# Patient Record
Sex: Female | Born: 1955 | Race: White | Hispanic: No | Marital: Married | Smoking: Current some day smoker
Health system: Southern US, Community
[De-identification: ages and names within clinical notes are randomized; demographics above are authoritative.]

## PROBLEM LIST (undated history)

## (undated) DIAGNOSIS — T7840XA Allergy, unspecified, initial encounter: Secondary | ICD-10-CM

## (undated) DIAGNOSIS — M1990 Unspecified osteoarthritis, unspecified site: Secondary | ICD-10-CM

## (undated) DIAGNOSIS — F32A Depression, unspecified: Secondary | ICD-10-CM

## (undated) DIAGNOSIS — F329 Major depressive disorder, single episode, unspecified: Secondary | ICD-10-CM

## (undated) HISTORY — PX: BREAST SURGERY: SHX581

## (undated) HISTORY — DX: Allergy, unspecified, initial encounter: T78.40XA

## (undated) HISTORY — PX: EYE SURGERY: SHX253

## (undated) HISTORY — DX: Unspecified osteoarthritis, unspecified site: M19.90

## (undated) HISTORY — DX: Major depressive disorder, single episode, unspecified: F32.9

## (undated) HISTORY — DX: Depression, unspecified: F32.A

---

## 1998-11-01 ENCOUNTER — Other Ambulatory Visit: Admission: RE | Admit: 1998-11-01 | Discharge: 1998-11-01 | Payer: Self-pay | Admitting: Family Medicine

## 1999-12-31 ENCOUNTER — Other Ambulatory Visit: Admission: RE | Admit: 1999-12-31 | Discharge: 1999-12-31 | Payer: Self-pay | Admitting: Family Medicine

## 2001-12-08 ENCOUNTER — Emergency Department (HOSPITAL_COMMUNITY): Admission: EM | Admit: 2001-12-08 | Discharge: 2001-12-08 | Payer: Self-pay | Admitting: *Deleted

## 2003-02-14 ENCOUNTER — Encounter: Admission: RE | Admit: 2003-02-14 | Discharge: 2003-02-14 | Payer: Self-pay | Admitting: Family Medicine

## 2003-03-09 ENCOUNTER — Other Ambulatory Visit: Admission: RE | Admit: 2003-03-09 | Discharge: 2003-03-09 | Payer: Self-pay | Admitting: Family Medicine

## 2003-03-22 ENCOUNTER — Encounter: Admission: RE | Admit: 2003-03-22 | Discharge: 2003-03-22 | Payer: Self-pay | Admitting: Family Medicine

## 2004-03-07 ENCOUNTER — Ambulatory Visit: Payer: Self-pay | Admitting: Family Medicine

## 2004-03-07 ENCOUNTER — Other Ambulatory Visit: Admission: RE | Admit: 2004-03-07 | Discharge: 2004-03-07 | Payer: Self-pay | Admitting: Family Medicine

## 2004-03-09 ENCOUNTER — Encounter: Admission: RE | Admit: 2004-03-09 | Discharge: 2004-03-09 | Payer: Self-pay | Admitting: Family Medicine

## 2004-03-27 ENCOUNTER — Ambulatory Visit: Payer: Self-pay | Admitting: Family Medicine

## 2004-04-06 ENCOUNTER — Encounter: Admission: RE | Admit: 2004-04-06 | Discharge: 2004-04-06 | Payer: Self-pay | Admitting: Family Medicine

## 2004-04-23 ENCOUNTER — Ambulatory Visit: Payer: Self-pay | Admitting: Internal Medicine

## 2005-02-04 ENCOUNTER — Ambulatory Visit: Payer: Self-pay | Admitting: Family Medicine

## 2005-02-28 IMAGING — CT CT PARANASAL SINUSES LIMITED
1 series · 16 of 24 positions shown, 20 images · non-contrast
Comparison: none

CLINICAL DATA: Headaches and facial pressure.
 LIMITED SINUS CT WITHOUT CONTRAST

[Series 2: limited sinus · axial · 0.33mm/px · z∈[-12,+73]mm · 16 of 24 slices shown, 20 images]
[im 2/24  brain]
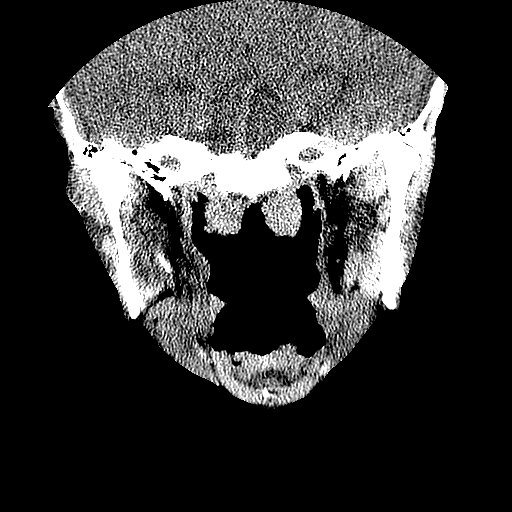
[im 2/24  bone]
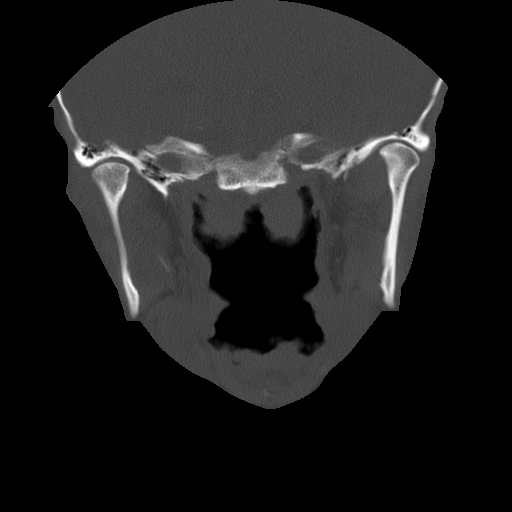
[im 4/24  bone]
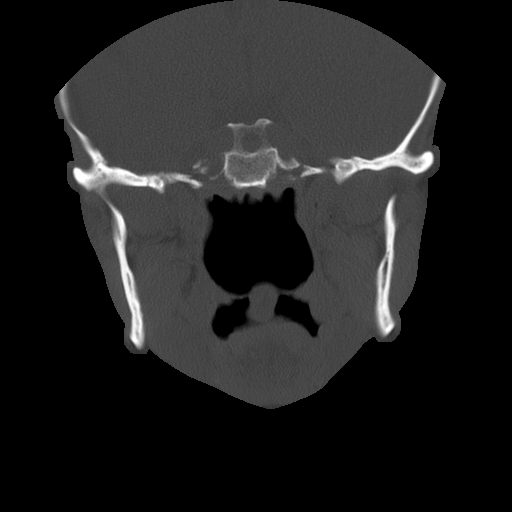
[im 5/24  bone]
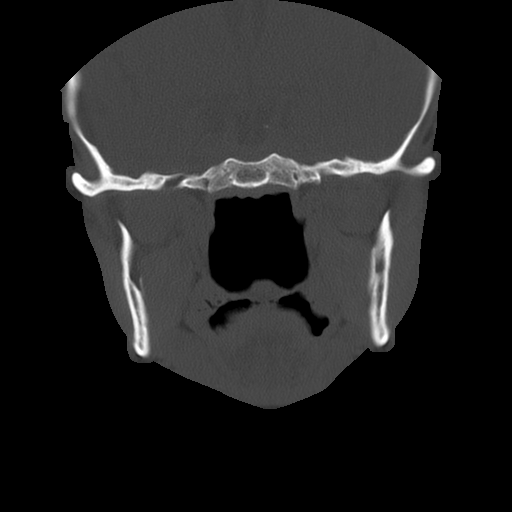
[im 6/24  bone]
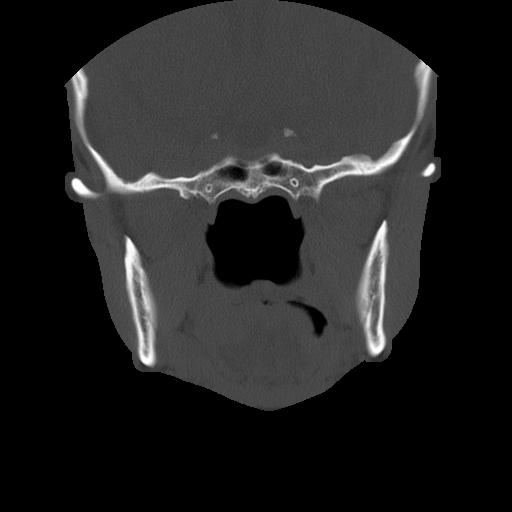
[im 8/24  brain]
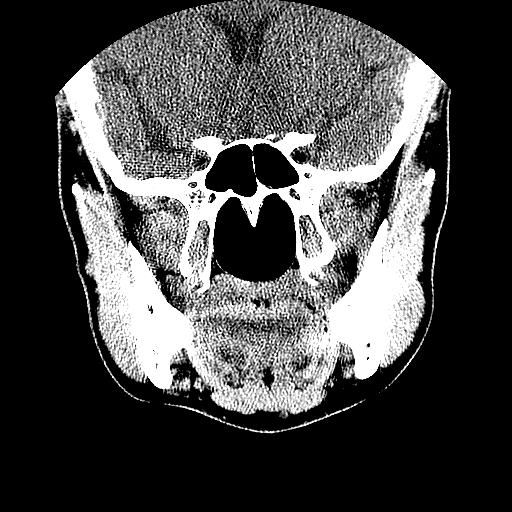
[im 8/24  bone]
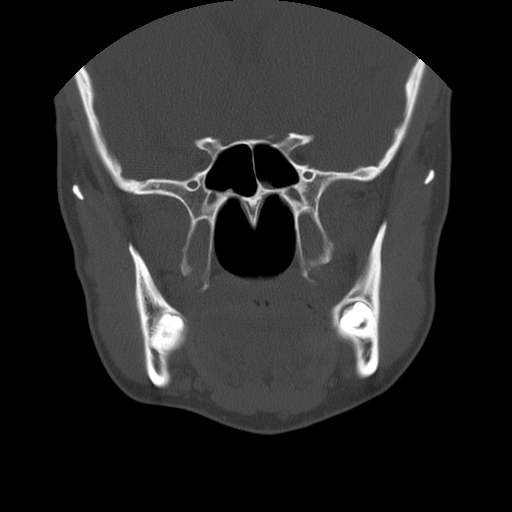
[im 9/24  bone]
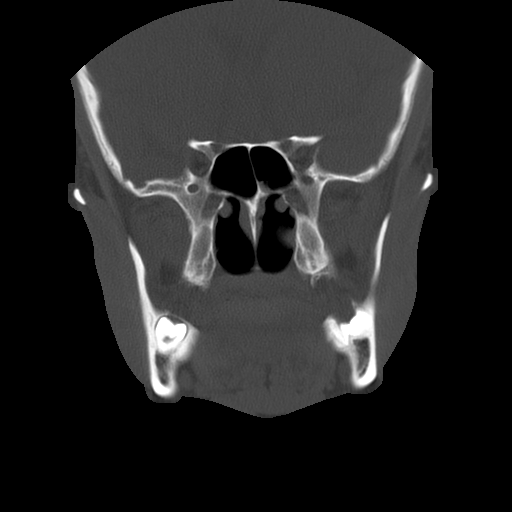
[im 10/24  bone]
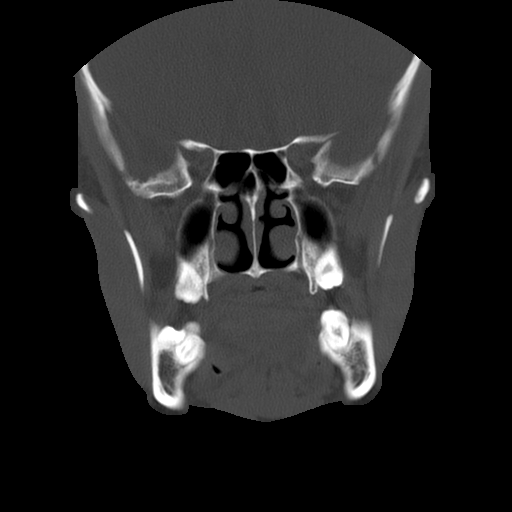
[im 12/24  bone]
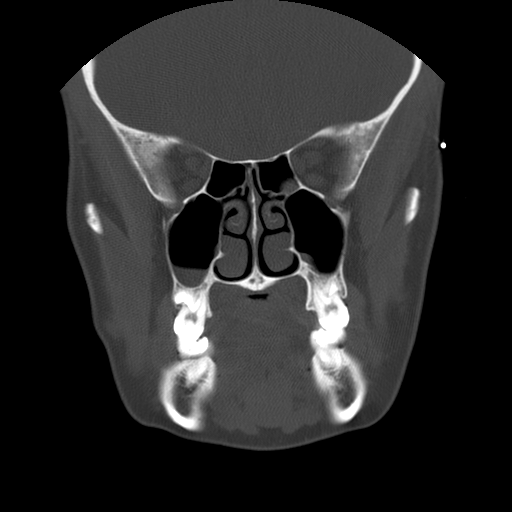
[im 13/24  brain]
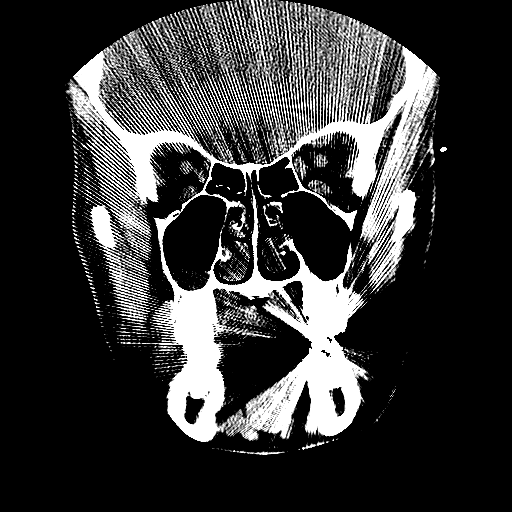
[im 13/24  bone]
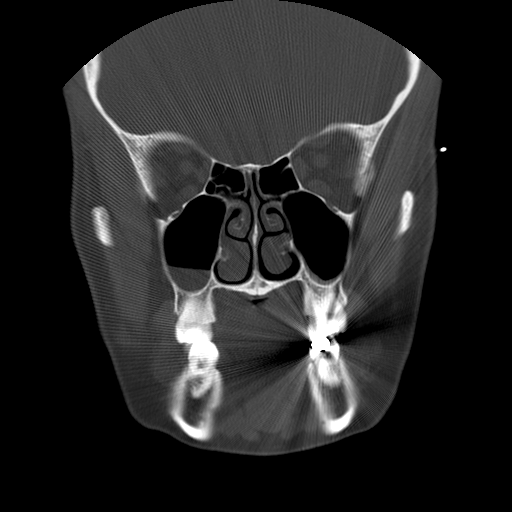
[im 15/24  bone]
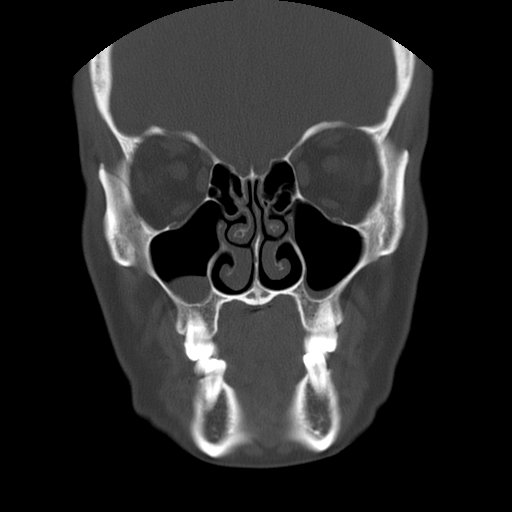
[im 16/24  bone]
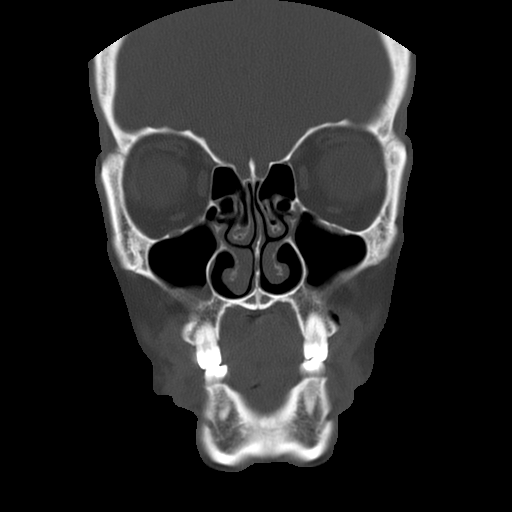
[im 17/24  bone]
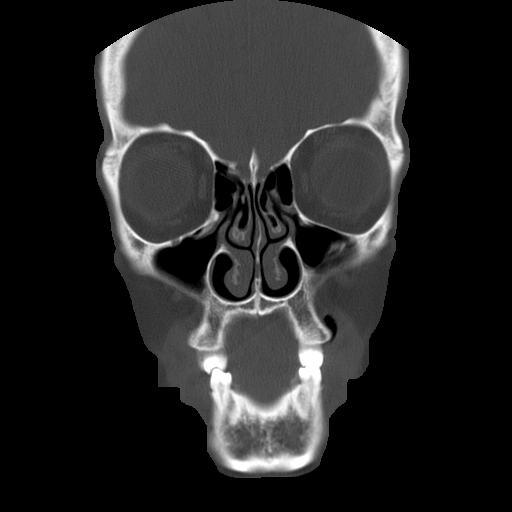
[im 19/24  brain]
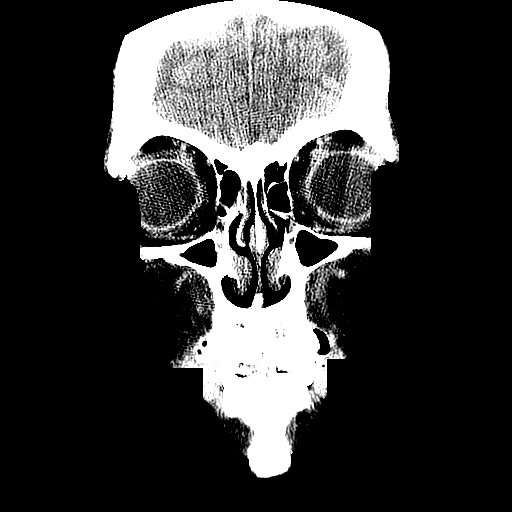
[im 19/24  bone]
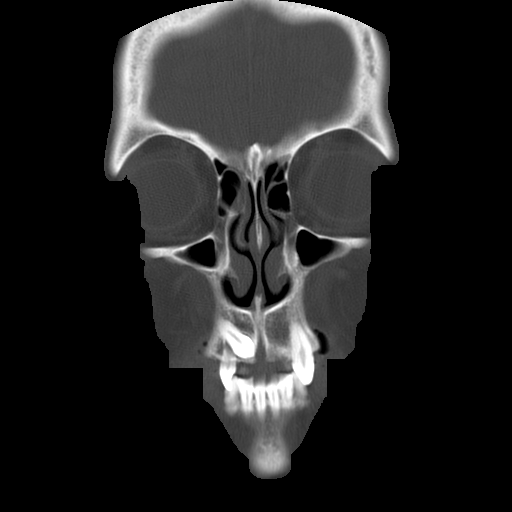
[im 20/24  bone]
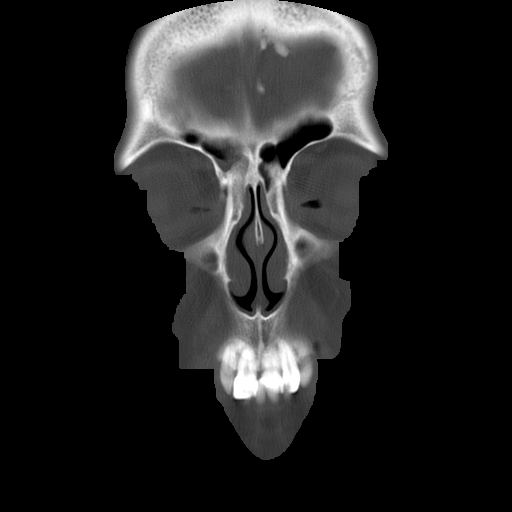
[im 21/24  bone]
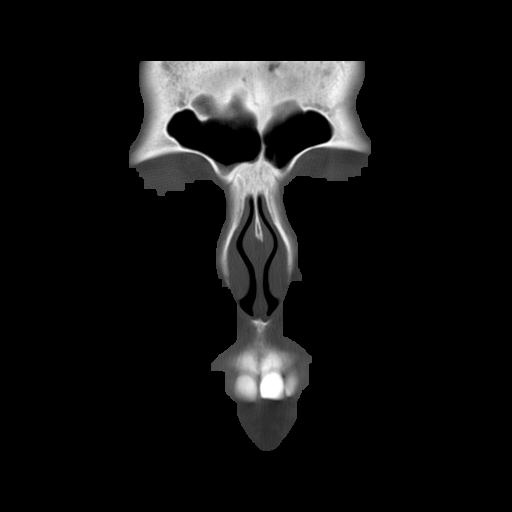
[im 23/24  bone]
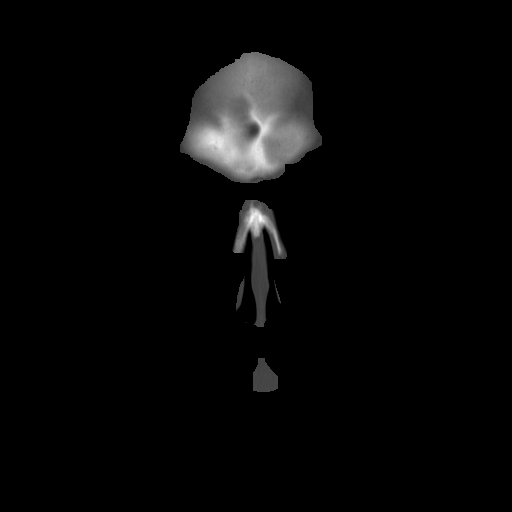

[16 of 24 positions shown; findings below may reference images not displayed]

FINDINGS: 4 mm noncontiguous direct coronal images were obtained through the face in bone algorithm.  Minimal mucosal thickening is seen in the right frontal sinus.  Air fluid levels are seen in the maxillary sinuses bilaterally, right greater than left.  There is polypoid mucosal thickening in the left sphenoid sinus.
 IMPRESSION
 Acute on chronic paranasal sinusitis.

 co

## 2005-03-08 ENCOUNTER — Ambulatory Visit: Payer: Self-pay | Admitting: Family Medicine

## 2005-03-08 ENCOUNTER — Encounter: Payer: Self-pay | Admitting: Family Medicine

## 2005-03-08 ENCOUNTER — Other Ambulatory Visit: Admission: RE | Admit: 2005-03-08 | Discharge: 2005-03-08 | Payer: Self-pay | Admitting: Family Medicine

## 2005-11-12 ENCOUNTER — Ambulatory Visit: Payer: Self-pay | Admitting: Family Medicine

## 2006-03-24 IMAGING — US US TRANSVAGINAL NON-OB
1 series · 14 of 25 positions shown · non-contrast
Comparison: None.

CLINICAL DATA: Dysfunctional uterine bleeding ? could not palpate left ovary on physical exam. 
 PELVIC ULTRASOUND WITH TRANSVAGINAL ULTRASOUND:

[Series 1: unknown · 0.24mm/px · 14 of 46 slices shown]
[im 1/46]
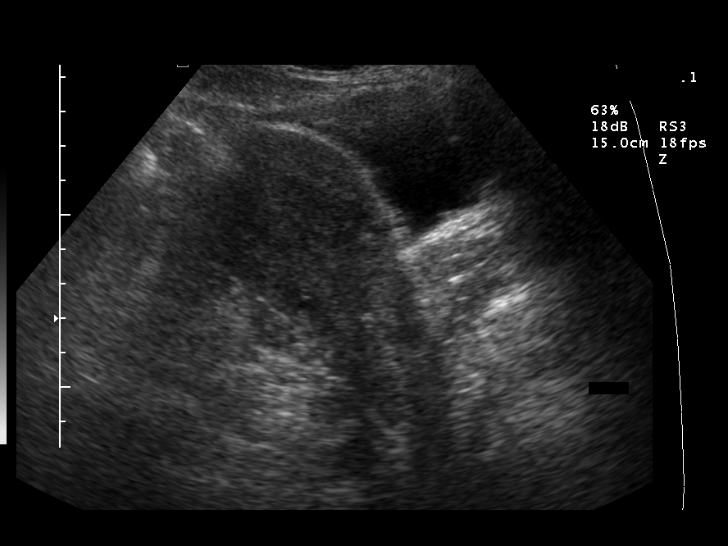
[im 4/46]
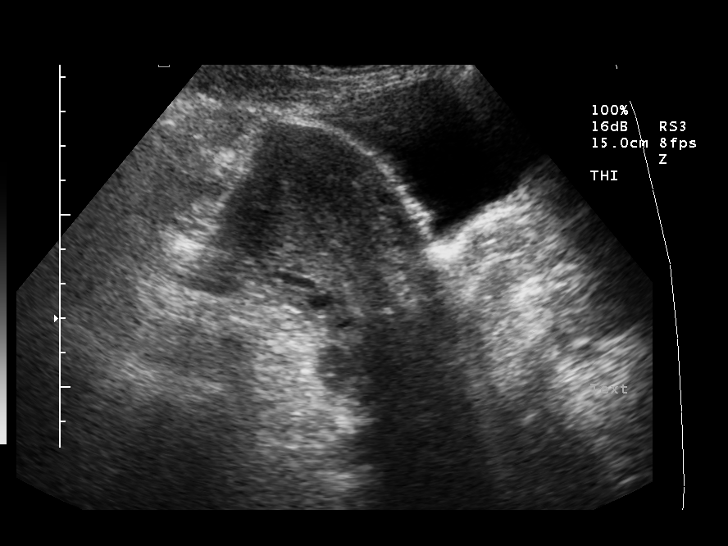
[im 8/46]
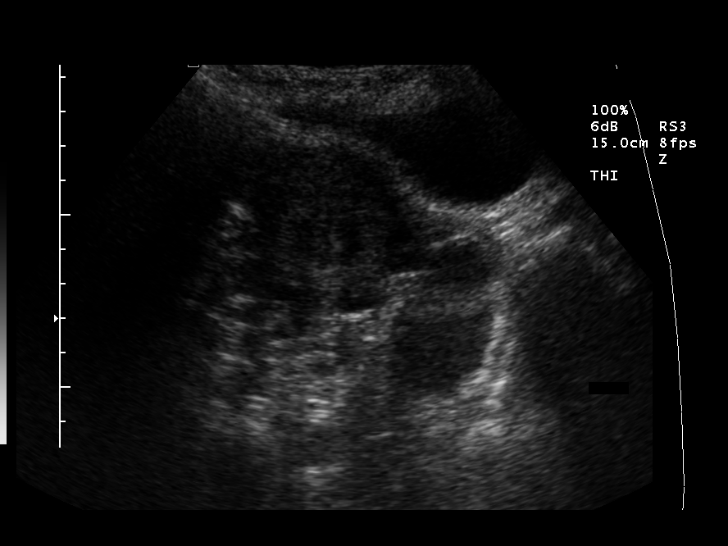
[im 12/46]
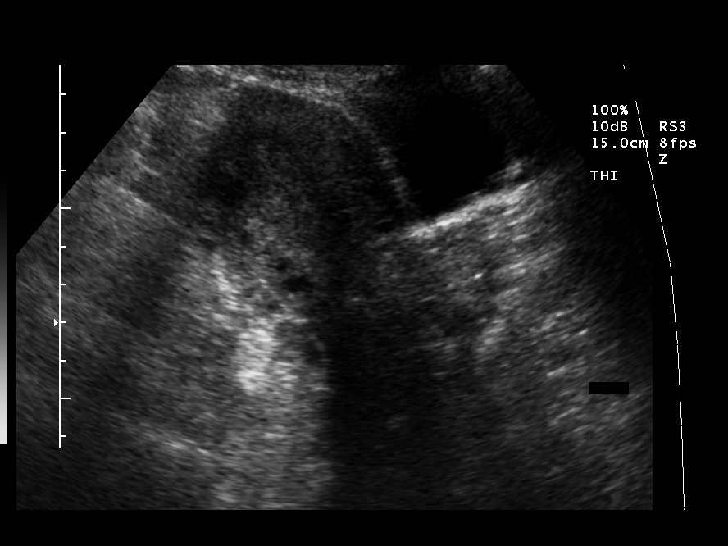
[im 16/46]
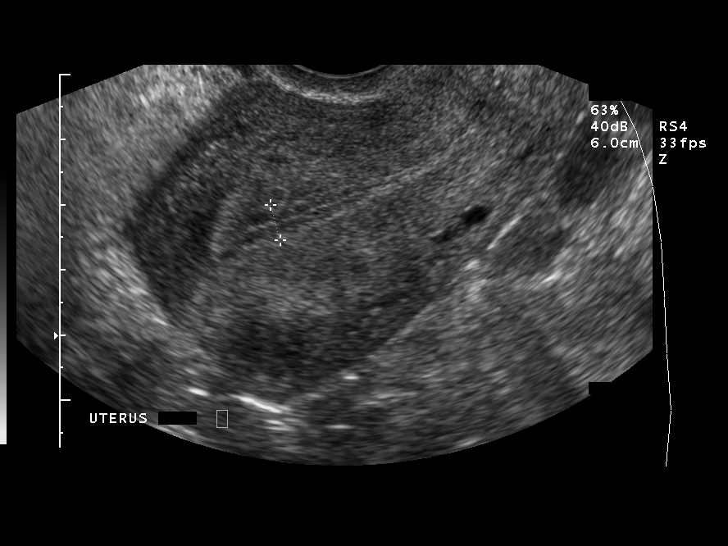
[im 17/46]
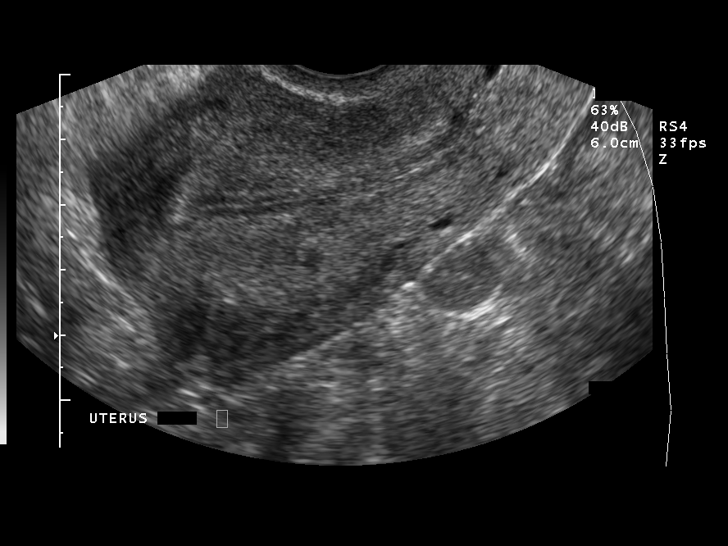
[im 21/46]
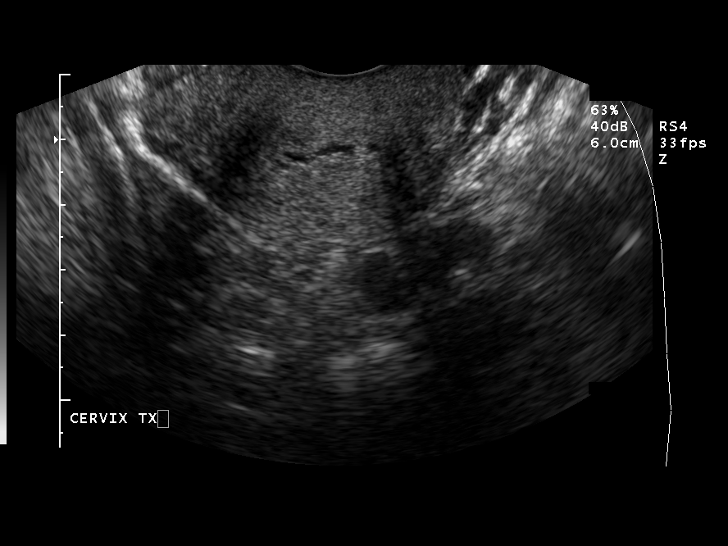
[im 25/46]
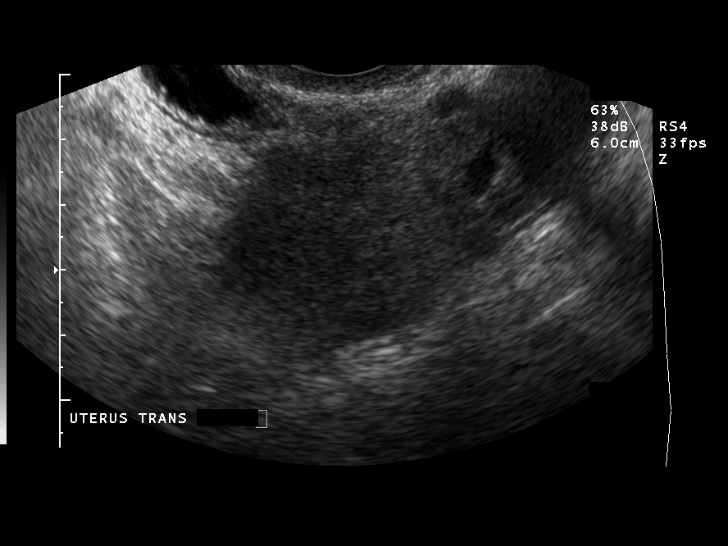
[im 29/46]
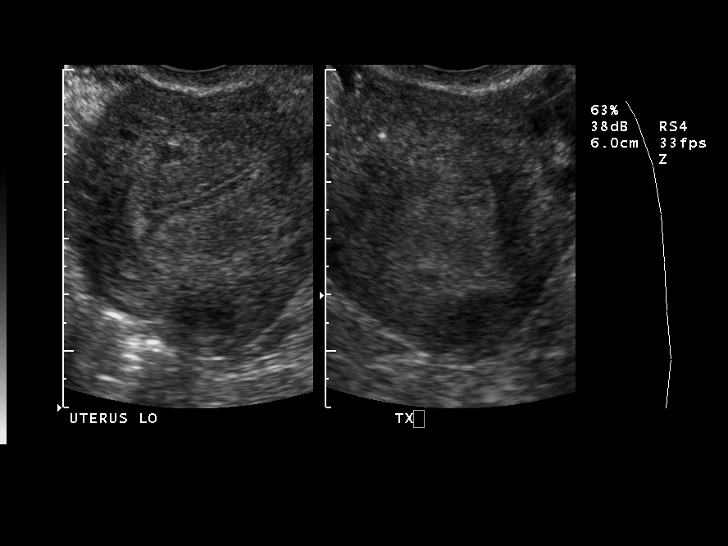
[im 31/46]
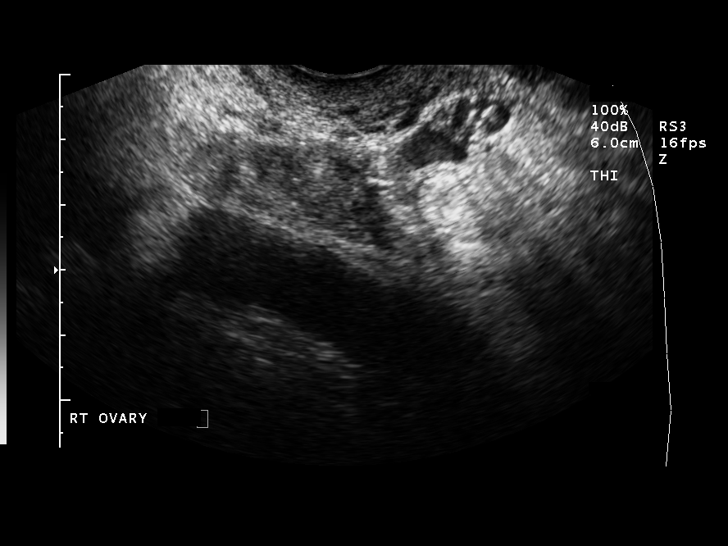
[im 34/46]
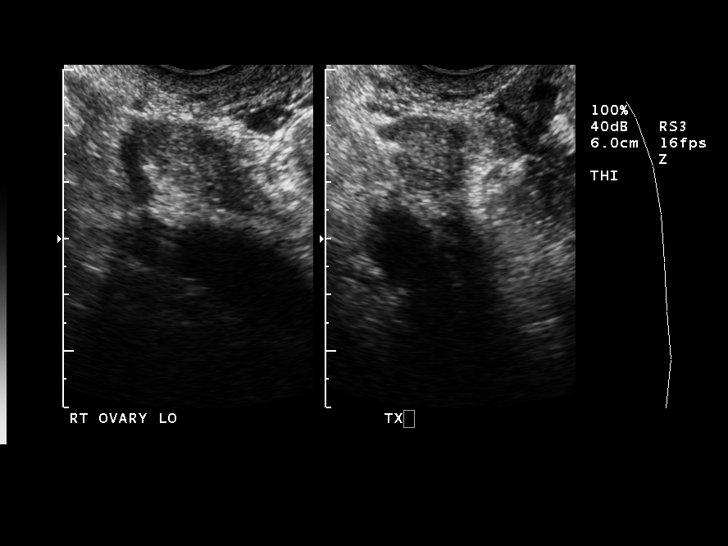
[im 38/46]
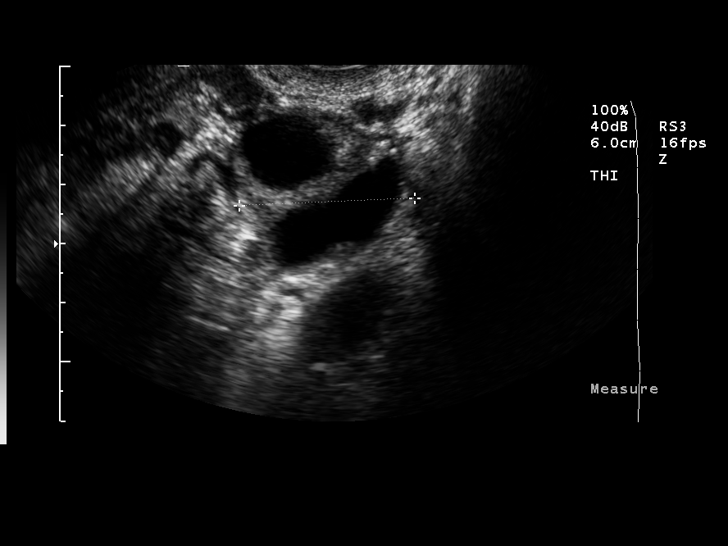
[im 42/46]
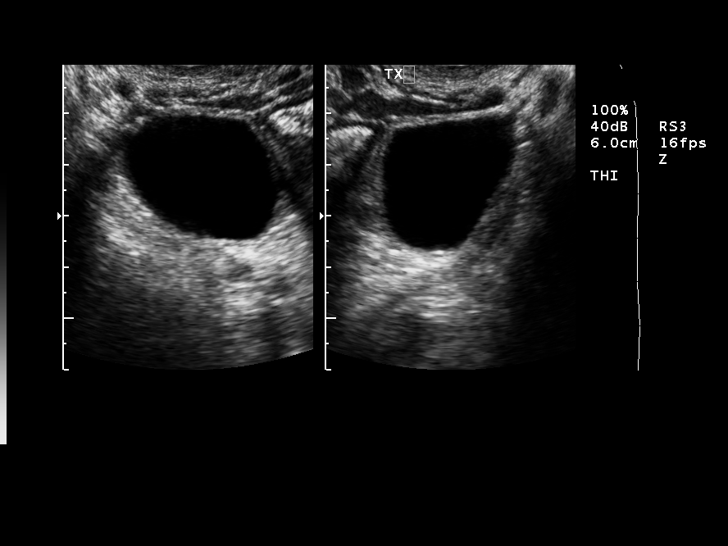
[im 46/46]
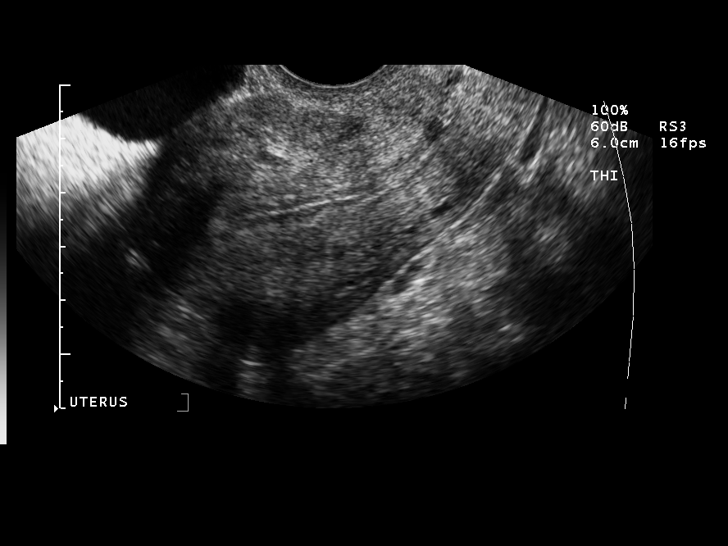

[14 of 25 positions shown; findings below may reference images not displayed]

FINDINGS: Overall uterine size is upper limits of normal with measurements of 9.2 x 4.7 x 5.4 cm.  Transvaginally, two small hypodense lesions of the myometrium are noted.  A 5 mm lesion is noted anteriorly, and a 12 mm lesion is noted posteriorly.  These are most consistent with small fibroids.  None are submucosal in location. The endometrium is normal, measuring 5.6 mm with a trilaminar appearance.  
 The right ovary is 2.5 x 1.2 x 1.2 cm.  No dominant cysts or masses.  The left ovary measures 5.4 x 2.8 x 3.0 cm.  Within the left ovary, there appear to be two simple cysts that are abutting.  One is 2.6 x 2.0 x 2.4 cm.  The other is 3.2 x 2.5 x 2.5 cm. 
 No fluid in the cul-de-sac.
IMPRESSION: 1.  Two small uterine fibroid lesions.
 2. Normal endometrium.
 3.  Normal right ovary.
 4.  Two simple cysts of the left ovary.

## 2006-03-27 ENCOUNTER — Encounter: Admission: RE | Admit: 2006-03-27 | Discharge: 2006-03-27 | Payer: Self-pay | Admitting: Orthopedic Surgery

## 2006-06-16 ENCOUNTER — Ambulatory Visit: Payer: Self-pay | Admitting: Internal Medicine

## 2006-06-16 LAB — CONVERTED CEMR LAB
Bilirubin Urine: NEGATIVE
Hemoglobin, Urine: NEGATIVE
Ketones, ur: NEGATIVE mg/dL
Leukocytes, UA: NEGATIVE
Nitrite: NEGATIVE
Protein, ur: NEGATIVE mg/dL
RBC / HPF: NONE SEEN (ref <3)
Specific Gravity, Urine: 1.022 (ref 1.005–1.03)
Urine Glucose: NEGATIVE mg/dL
Urobilinogen, UA: 0.2 (ref 0.0–1.0)
WBC, UA: NONE SEEN cells/hpf (ref <3)
pH: 6 (ref 5.0–8.0)

## 2006-06-17 ENCOUNTER — Telehealth (INDEPENDENT_AMBULATORY_CARE_PROVIDER_SITE_OTHER): Payer: Self-pay | Admitting: *Deleted

## 2006-08-15 DIAGNOSIS — F329 Major depressive disorder, single episode, unspecified: Secondary | ICD-10-CM

## 2006-09-02 ENCOUNTER — Ambulatory Visit: Payer: Self-pay | Admitting: Internal Medicine

## 2006-09-09 ENCOUNTER — Ambulatory Visit: Payer: Self-pay | Admitting: Family Medicine

## 2006-09-09 DIAGNOSIS — Z9889 Other specified postprocedural states: Secondary | ICD-10-CM

## 2006-09-09 DIAGNOSIS — M1990 Unspecified osteoarthritis, unspecified site: Secondary | ICD-10-CM | POA: Insufficient documentation

## 2006-09-09 DIAGNOSIS — D179 Benign lipomatous neoplasm, unspecified: Secondary | ICD-10-CM | POA: Insufficient documentation

## 2006-09-09 DIAGNOSIS — B354 Tinea corporis: Secondary | ICD-10-CM | POA: Insufficient documentation

## 2006-10-16 ENCOUNTER — Ambulatory Visit: Payer: Self-pay | Admitting: Family Medicine

## 2006-10-16 DIAGNOSIS — J019 Acute sinusitis, unspecified: Secondary | ICD-10-CM | POA: Insufficient documentation

## 2006-10-20 ENCOUNTER — Encounter: Payer: Self-pay | Admitting: Internal Medicine

## 2006-10-20 ENCOUNTER — Encounter: Payer: Self-pay | Admitting: Family Medicine

## 2006-10-20 ENCOUNTER — Ambulatory Visit: Payer: Self-pay | Admitting: Internal Medicine

## 2006-11-10 ENCOUNTER — Encounter: Payer: Self-pay | Admitting: Family Medicine

## 2007-03-27 ENCOUNTER — Encounter: Admission: RE | Admit: 2007-03-27 | Discharge: 2007-03-27 | Payer: Self-pay | Admitting: Otolaryngology

## 2007-04-03 ENCOUNTER — Encounter: Admission: RE | Admit: 2007-04-03 | Discharge: 2007-04-03 | Payer: Self-pay | Admitting: Orthopedic Surgery

## 2007-06-08 ENCOUNTER — Telehealth (INDEPENDENT_AMBULATORY_CARE_PROVIDER_SITE_OTHER): Payer: Self-pay | Admitting: *Deleted

## 2007-06-18 ENCOUNTER — Ambulatory Visit: Payer: Self-pay | Admitting: Family Medicine

## 2007-09-11 ENCOUNTER — Ambulatory Visit: Payer: Self-pay | Admitting: Family Medicine

## 2007-09-11 DIAGNOSIS — J069 Acute upper respiratory infection, unspecified: Secondary | ICD-10-CM | POA: Insufficient documentation

## 2007-09-14 ENCOUNTER — Telehealth (INDEPENDENT_AMBULATORY_CARE_PROVIDER_SITE_OTHER): Payer: Self-pay | Admitting: *Deleted

## 2007-10-12 ENCOUNTER — Ambulatory Visit: Payer: Self-pay | Admitting: Family Medicine

## 2007-11-06 ENCOUNTER — Encounter: Payer: Self-pay | Admitting: Family Medicine

## 2008-06-10 ENCOUNTER — Encounter: Payer: Self-pay | Admitting: Family Medicine

## 2008-06-21 ENCOUNTER — Other Ambulatory Visit: Admission: RE | Admit: 2008-06-21 | Discharge: 2008-06-21 | Payer: Self-pay | Admitting: Family Medicine

## 2008-06-21 ENCOUNTER — Ambulatory Visit: Payer: Self-pay | Admitting: Family Medicine

## 2008-06-21 ENCOUNTER — Encounter: Payer: Self-pay | Admitting: Family Medicine

## 2008-06-22 ENCOUNTER — Encounter (INDEPENDENT_AMBULATORY_CARE_PROVIDER_SITE_OTHER): Payer: Self-pay | Admitting: *Deleted

## 2008-06-24 ENCOUNTER — Encounter (INDEPENDENT_AMBULATORY_CARE_PROVIDER_SITE_OTHER): Payer: Self-pay | Admitting: *Deleted

## 2008-07-07 ENCOUNTER — Encounter (INDEPENDENT_AMBULATORY_CARE_PROVIDER_SITE_OTHER): Payer: Self-pay | Admitting: *Deleted

## 2008-08-01 ENCOUNTER — Encounter: Payer: Self-pay | Admitting: Family Medicine

## 2008-11-23 ENCOUNTER — Telehealth (INDEPENDENT_AMBULATORY_CARE_PROVIDER_SITE_OTHER): Payer: Self-pay | Admitting: *Deleted

## 2008-11-24 ENCOUNTER — Telehealth (INDEPENDENT_AMBULATORY_CARE_PROVIDER_SITE_OTHER): Payer: Self-pay | Admitting: *Deleted

## 2008-11-24 ENCOUNTER — Ambulatory Visit: Payer: Self-pay | Admitting: Family Medicine

## 2009-01-31 ENCOUNTER — Ambulatory Visit: Payer: Self-pay | Admitting: Family Medicine

## 2009-07-14 ENCOUNTER — Ambulatory Visit: Payer: Self-pay | Admitting: Family Medicine

## 2009-07-14 DIAGNOSIS — B360 Pityriasis versicolor: Secondary | ICD-10-CM

## 2009-07-25 ENCOUNTER — Ambulatory Visit: Payer: Self-pay | Admitting: Family Medicine

## 2009-08-17 ENCOUNTER — Ambulatory Visit: Payer: Self-pay | Admitting: Family Medicine

## 2009-08-17 ENCOUNTER — Other Ambulatory Visit: Admission: RE | Admit: 2009-08-17 | Discharge: 2009-08-17 | Payer: Self-pay | Admitting: Family Medicine

## 2009-08-17 DIAGNOSIS — D239 Other benign neoplasm of skin, unspecified: Secondary | ICD-10-CM | POA: Insufficient documentation

## 2009-08-23 ENCOUNTER — Telehealth (INDEPENDENT_AMBULATORY_CARE_PROVIDER_SITE_OTHER): Payer: Self-pay | Admitting: *Deleted

## 2009-08-24 ENCOUNTER — Encounter: Payer: Self-pay | Admitting: Family Medicine

## 2009-09-14 ENCOUNTER — Encounter: Payer: Self-pay | Admitting: Family Medicine

## 2009-10-06 ENCOUNTER — Ambulatory Visit (HOSPITAL_BASED_OUTPATIENT_CLINIC_OR_DEPARTMENT_OTHER): Admission: RE | Admit: 2009-10-06 | Discharge: 2009-10-06 | Payer: Self-pay | Admitting: Surgery

## 2010-02-02 ENCOUNTER — Ambulatory Visit: Payer: Self-pay | Admitting: Family Medicine

## 2010-02-02 DIAGNOSIS — R300 Dysuria: Secondary | ICD-10-CM

## 2010-02-02 LAB — CONVERTED CEMR LAB
Bilirubin Urine: NEGATIVE
Glucose, Urine, Semiquant: NEGATIVE
Ketones, urine, test strip: NEGATIVE
Nitrite: NEGATIVE
Specific Gravity, Urine: 1.02
Urobilinogen, UA: 0.2
pH: 6

## 2010-02-03 ENCOUNTER — Encounter: Payer: Self-pay | Admitting: Family Medicine

## 2010-02-05 ENCOUNTER — Telehealth (INDEPENDENT_AMBULATORY_CARE_PROVIDER_SITE_OTHER): Payer: Self-pay | Admitting: *Deleted

## 2010-02-05 LAB — CONVERTED CEMR LAB
Chlamydia, Swab/Urine, PCR: NEGATIVE
GC Probe Amp, Urine: NEGATIVE

## 2010-03-05 ENCOUNTER — Ambulatory Visit: Payer: Self-pay | Admitting: Family Medicine

## 2010-03-05 ENCOUNTER — Other Ambulatory Visit
Admission: RE | Admit: 2010-03-05 | Discharge: 2010-03-05 | Payer: Self-pay | Source: Home / Self Care | Admitting: Family Medicine

## 2010-03-05 DIAGNOSIS — R6882 Decreased libido: Secondary | ICD-10-CM | POA: Insufficient documentation

## 2010-03-05 DIAGNOSIS — R87619 Unspecified abnormal cytological findings in specimens from cervix uteri: Secondary | ICD-10-CM

## 2010-03-08 ENCOUNTER — Encounter: Payer: Self-pay | Admitting: Family Medicine

## 2010-03-08 LAB — CONVERTED CEMR LAB: Pap Smear: NEGATIVE

## 2010-04-02 ENCOUNTER — Ambulatory Visit
Admission: RE | Admit: 2010-04-02 | Discharge: 2010-04-02 | Payer: Self-pay | Source: Home / Self Care | Attending: Family Medicine | Admitting: Family Medicine

## 2010-04-02 DIAGNOSIS — N952 Postmenopausal atrophic vaginitis: Secondary | ICD-10-CM | POA: Insufficient documentation

## 2010-04-08 ENCOUNTER — Encounter: Payer: Self-pay | Admitting: Family Medicine

## 2010-04-15 LAB — CONVERTED CEMR LAB
ALT: 13 units/L (ref 0–40)
ALT: 17 units/L (ref 0–35)
ALT: 24 units/L (ref 0–35)
AST: 16 units/L (ref 0–37)
AST: 21 units/L (ref 0–37)
AST: 22 units/L (ref 0–37)
Albumin: 4.1 g/dL (ref 3.5–5.2)
Albumin: 4.3 g/dL (ref 3.5–5.2)
Albumin: 4.3 g/dL (ref 3.5–5.2)
Alkaline Phosphatase: 55 units/L (ref 39–117)
Alkaline Phosphatase: 62 units/L (ref 39–117)
Alkaline Phosphatase: 65 units/L (ref 39–117)
BUN: 14 mg/dL (ref 6–23)
BUN: 14 mg/dL (ref 6–23)
BUN: 16 mg/dL (ref 6–23)
Basophils Absolute: 0 10*3/uL (ref 0.0–0.1)
Basophils Absolute: 0 10*3/uL (ref 0.0–0.1)
Basophils Absolute: 0 10*3/uL (ref 0.0–0.1)
Basophils Relative: 0.3 % (ref 0.0–3.0)
Basophils Relative: 0.5 % (ref 0.0–3.0)
Basophils Relative: 0.9 % (ref 0.0–1.0)
Bilirubin Urine: NEGATIVE
Bilirubin, Direct: 0 mg/dL (ref 0.0–0.3)
Bilirubin, Direct: 0.1 mg/dL (ref 0.0–0.3)
Bilirubin, Direct: 0.2 mg/dL (ref 0.0–0.3)
Blood in Urine, dipstick: NEGATIVE
CO2: 30 meq/L (ref 19–32)
CO2: 31 meq/L (ref 19–32)
CO2: 31 meq/L (ref 19–32)
Calcium: 9.3 mg/dL (ref 8.4–10.5)
Calcium: 9.5 mg/dL (ref 8.4–10.5)
Calcium: 9.6 mg/dL (ref 8.4–10.5)
Chloride: 104 meq/L (ref 96–112)
Chloride: 105 meq/L (ref 96–112)
Chloride: 108 meq/L (ref 96–112)
Cholesterol: 251 mg/dL — ABNORMAL HIGH (ref 0–200)
Cholesterol: 256 mg/dL (ref 0–200)
Cholesterol: 265 mg/dL — ABNORMAL HIGH (ref 0–200)
Creatinine, Ser: 0.4 mg/dL (ref 0.4–1.2)
Creatinine, Ser: 0.5 mg/dL (ref 0.4–1.2)
Creatinine, Ser: 0.6 mg/dL (ref 0.4–1.2)
Direct LDL: 137.5 mg/dL
Direct LDL: 144.3 mg/dL
Direct LDL: 158.6 mg/dL
Eosinophils Absolute: 0.1 10*3/uL (ref 0.0–0.6)
Eosinophils Absolute: 0.1 10*3/uL (ref 0.0–0.7)
Eosinophils Absolute: 0.1 10*3/uL (ref 0.0–0.7)
Eosinophils Relative: 1.4 % (ref 0.0–5.0)
Eosinophils Relative: 2.5 % (ref 0.0–5.0)
Eosinophils Relative: 2.5 % (ref 0.0–5.0)
GFR calc Af Amer: 136 mL/min
GFR calc non Af Amer: 112 mL/min
GFR calc non Af Amer: 137.6 mL/min (ref >60)
GFR calc non Af Amer: 167.52 mL/min (ref >60)
Glucose, Bld: 88 mg/dL (ref 70–99)
Glucose, Bld: 90 mg/dL (ref 70–99)
Glucose, Bld: 91 mg/dL (ref 70–99)
Glucose, Urine, Semiquant: NEGATIVE
HCT: 36.1 % (ref 36.0–46.0)
HCT: 38 % (ref 36.0–46.0)
HCT: 40.2 % (ref 36.0–46.0)
HDL: 66.4 mg/dL (ref >39.0)
HDL: 86.3 mg/dL (ref >39.00)
HDL: 90.1 mg/dL (ref >39.00)
Hemoglobin: 12.6 g/dL (ref 12.0–15.0)
Hemoglobin: 13 g/dL (ref 12.0–15.0)
Hemoglobin: 13.5 g/dL (ref 12.0–15.0)
Ketones, urine, test strip: NEGATIVE
Lymphocytes Relative: 17.3 % (ref 12.0–46.0)
Lymphocytes Relative: 25.3 % (ref 12.0–46.0)
Lymphocytes Relative: 32.1 % (ref 12.0–46.0)
Lymphs Abs: 0.9 10*3/uL (ref 0.7–4.0)
Lymphs Abs: 1.4 10*3/uL (ref 0.7–4.0)
MCHC: 33.6 g/dL (ref 30.0–36.0)
MCHC: 34.3 g/dL (ref 30.0–36.0)
MCHC: 34.8 g/dL (ref 30.0–36.0)
MCV: 96.3 fL (ref 78.0–100.0)
MCV: 96.6 fL (ref 78.0–100.0)
MCV: 97.2 fL (ref 78.0–100.0)
Monocytes Absolute: 0.4 10*3/uL (ref 0.1–1.0)
Monocytes Absolute: 0.6 10*3/uL (ref 0.1–1.0)
Monocytes Absolute: 0.6 10*3/uL (ref 0.2–0.7)
Monocytes Relative: 10.5 % (ref 3.0–12.0)
Monocytes Relative: 10.9 % (ref 3.0–11.0)
Monocytes Relative: 9.1 % (ref 3.0–12.0)
Neutro Abs: 2.6 10*3/uL (ref 1.4–7.7)
Neutro Abs: 3.2 10*3/uL (ref 1.4–7.7)
Neutro Abs: 3.7 10*3/uL (ref 1.4–7.7)
Neutrophils Relative %: 57.1 % (ref 43.0–77.0)
Neutrophils Relative %: 60.4 % (ref 43.0–77.0)
Neutrophils Relative %: 69.2 % (ref 43.0–77.0)
Nitrite: NEGATIVE
Pap Smear: NEGATIVE
Platelets: 258 10*3/uL (ref 150.0–400.0)
Platelets: 259 10*3/uL (ref 150.0–400.0)
Platelets: 291 10*3/uL (ref 150–400)
Potassium: 3.7 meq/L (ref 3.5–5.1)
Potassium: 3.7 meq/L (ref 3.5–5.1)
Potassium: 3.8 meq/L (ref 3.5–5.1)
Protein, U semiquant: NEGATIVE
RBC: 3.75 M/uL — ABNORMAL LOW (ref 3.87–5.11)
RBC: 3.91 M/uL (ref 3.87–5.11)
RBC: 4.16 M/uL (ref 3.87–5.11)
RDW: 12.5 % (ref 11.5–14.6)
RDW: 13 % (ref 11.5–14.6)
RDW: 14.4 % (ref 11.5–14.6)
Sodium: 140 meq/L (ref 135–145)
Sodium: 141 meq/L (ref 135–145)
Sodium: 144 meq/L (ref 135–145)
Specific Gravity, Urine: 1.025
TSH: 1.1 microintl units/mL (ref 0.35–5.50)
TSH: 1.22 microintl units/mL (ref 0.35–5.50)
TSH: 1.83 microintl units/mL (ref 0.35–5.50)
Total Bilirubin: 0.8 mg/dL (ref 0.3–1.2)
Total Bilirubin: 0.9 mg/dL (ref 0.3–1.2)
Total Bilirubin: 1 mg/dL (ref 0.3–1.2)
Total CHOL/HDL Ratio: 3
Total CHOL/HDL Ratio: 3
Total CHOL/HDL Ratio: 3.9
Total Protein: 6.9 g/dL (ref 6.0–8.3)
Total Protein: 7 g/dL (ref 6.0–8.3)
Total Protein: 7.1 g/dL (ref 6.0–8.3)
Triglycerides: 116 mg/dL (ref 0–149)
Triglycerides: 75 mg/dL (ref 0.0–149.0)
Triglycerides: 92 mg/dL (ref 0.0–149.0)
Urobilinogen, UA: NEGATIVE
VLDL: 15 mg/dL (ref 0.0–40.0)
VLDL: 18.4 mg/dL (ref 0.0–40.0)
VLDL: 23 mg/dL (ref 0–40)
WBC Urine, dipstick: NEGATIVE
WBC: 4.5 10*3/uL (ref 4.5–10.5)
WBC: 5.2 10*3/uL (ref 4.5–10.5)
WBC: 5.4 10*3/uL (ref 4.5–10.5)
pH: 8.5

## 2010-04-17 NOTE — Assessment & Plan Note (Signed)
Summary: CPX--PH   Vital Signs:  Patient profile:   55 year old female Height:      65 inches Weight:      126 pounds Pulse rate:   70 / minute Pulse rhythm:   regular BP sitting:   116 / 84  (left arm) Cuff size:   regular  Vitals Entered By: Army Fossa CMA (August 17, 2009 8:25 AM) CC: Pt here for CPX, pap.    History of Present Illness: Pt here for cpe.  She is doing much better.  The abilify seems to be working but she ran out and pharmacy states they didn't receive the rx.    Pt also c/o bowels have changed----she used to have to take something to go to bathroom everyday and now she does not need to anymore---she feels fine---no pain, diarrhea etc  Preventive Screening-Counseling & Management  Alcohol-Tobacco     Alcohol drinks/day: <1     Alcohol type: spirits     >5/day in last 3 mos: no     Alcohol Counseling: not indicated; use of alcohol is not excessive or problematic     Smoking Status: current     Smoking Cessation Counseling: yes     Smoke Cessation Stage: precontemplative     Packs/Day: <0.25     Year Started: 1988     Year Quit: 05/2005--restarted May 2011     Pack years: 13  Caffeine-Diet-Exercise     Caffeine use/day: 1-2     Caffeine Counseling: not indicated; caffeine use is not excessive or problematic     Does Patient Exercise: no  Hep-HIV-STD-Contraception     Hepatitis Risk: no risk noted     Hepatitis Risk Counseling: to avoid increased hepatitis risk     HIV Risk: no risk noted     HIV Risk Counseling: not indicated-no HIV risk noted     STD Risk: no risk noted     STD Risk Counseling: not indicated-no STD risk noted     Contraception Counseling: not applicable     Dental Visit-last 6 months yes     Dental Care Counseling: not indicated; dental care within six months     SBE monthly: no     SBE Education/Counseling: to perform regular SBE     Sun Exposure-Excessive: no     Sun Exposure Counseling: not indicated; sun exposure is  acceptable  Safety-Violence-Falls     Seat Belt Use: yes     Seat Belt Counseling: not indicated; patient wears seat belts     Firearms in the Home: no firearms in the home     Firearm Counseling: not applicable     Smoke Detectors: yes     Smoke Detector Counseling: no     Violence in the Home: no risk noted     Violence Counseling: not applicable     Sexual Abuse: no     Sexual Abuse Counseling: no      Sexual History:  currently monogamous and married.        Drug Use:  never.    Current Medications (verified): 1)  Glucosamine-Chondroitin 500-400 Mg  Tabs (Glucosamine-Chondroitin) 2)  Prozac 40 Mg Caps (Fluoxetine Hcl) .... Take One Capsule Daily 3)  Abilify 5 Mg Tabs (Aripiprazole) .Marland Kitchen.. 1 By Mouth Once Daily 4)  Phillips Liqui-Gels 100 Mg Caps (Docusate Sodium) .... 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 Family History of Alcoholism/Addiction Family History of Arthritis Family History Diabetes 1st degree relative Family History Hypertension F-- alz dementia  Social  History: Reviewed history from 06/21/2008 and no changes required. 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 Eyes:  Denies blurring, discharge, double vision, eye irritation, eye pain, halos, itching, light sensitivity, red eye, vision loss-1 eye, and vision loss-both eyes; optho-- q1y. ENT:  Denies decreased hearing, difficulty swallowing, ear discharge, earache, hoarseness, nasal congestion, nosebleeds, postnasal drainage, ringing in ears, sinus pressure, and sore throat. CV:  Denies bluish discoloration of lips or nails, chest pain or discomfort, difficulty breathing at night, difficulty breathing while lying down, fainting, fatigue, leg cramps with exertion, lightheadness, near fainting, palpitations, shortness of breath with exertion, swelling of feet, swelling of hands, and weight gain. Resp:  Denies chest discomfort, chest pain with inspiration, cough, coughing up blood, excessive snoring, hypersomnolence, morning headaches, pleuritic, shortness of breath, sputum productive, and wheezing. GI:  Denies abdominal pain, bloody stools, change in bowel habits, constipation, dark tarry stools, diarrhea, excessive appetite, gas, hemorrhoids, indigestion, loss of appetite, nausea, vomiting, vomiting blood, and yellowish skin color. GU:  Denies abnormal vaginal bleeding, decreased libido, discharge, dysuria, genital sores, hematuria, incontinence, nocturia, urinary frequency, and urinary hesitancy. MS:  Denies joint pain, joint redness, joint swelling, loss of strength, low back pain, mid back pain, muscle aches, muscle , cramps, muscle weakness, stiffness, and thoracic pain. Derm:  Complains of  lesion(s); denies changes in color of skin, changes in nail beds, dryness, excessive perspiration, flushing, hair loss, insect bite(s), itching, poor wound healing, and rash; R arm--- lesion L arm recently became tender. Neuro:  Denies brief paralysis, difficulty with concentration, disturbances in coordination, falling down, headaches, inability to speak, memory loss, numbness, poor balance, seizures, sensation of room spinning, tingling, tremors, visual disturbances, and weakness. Psych:  Denies alternate hallucination ( auditory/visual), anxiety, depression, easily angered, easily tearful, irritability, mental problems, panic attacks, sense of great danger, suicidal thoughts/plans, thoughts of violence, unusual visions or sounds, and thoughts /plans of harming others. Endo:  Denies cold intolerance, excessive hunger, excessive thirst, excessive urination, heat intolerance, polyuria, and weight change. Heme:  Denies abnormal bruising, bleeding, enlarge lymph nodes, fevers, pallor, and skin discoloration. Allergy:  Denies hives or rash, itching eyes, persistent infections, seasonal allergies, and sneezing.  Physical Exam  General:  Well-developed,well-nourished,in no acute distress; alert,appropriate and cooperative throughout examination Head:  Normocephalic and atraumatic without obvious abnormalities. No apparent alopecia or balding. Eyes:  vision grossly intact, pupils equal, pupils round, pupils reactive to light, pupils react to accomodation, and no injection.   Ears:  External ear  exam shows no significant lesions or deformities.  Otoscopic examination reveals clear canals, tympanic membranes are intact bilaterally without bulging, retraction, inflammation or discharge. Hearing is grossly normal bilaterally. Nose:  External nasal examination shows no deformity or inflammation. Nasal mucosa are pink and moist without lesions or exudates. Mouth:  Oral mucosa and oropharynx without lesions or  exudates.  Teeth in good repair. Neck:  No deformities, masses, or tenderness noted.no carotid bruits.   Breasts:  No mass, nodules, thickening, tenderness, bulging, retraction, inflamation, nipple discharge or skin changes noted.   Lungs:  Normal respiratory effort, chest expands symmetrically. Lungs are clear to auscultation, no crackles or wheezes. Heart:  normal rate and no murmur.   Abdomen:  Bowel sounds positive,abdomen soft and non-tender without masses, organomegaly or hernias noted. Rectal:  No external abnormalities noted. Normal sphincter tone. No rectal masses or tenderness. Heme negative brown stool Genitalia:  Pelvic Exam:        External: normal female genitalia without lesions or masses        Vagina: normal without lesions or masses        Cervix: normal without lesions or masses        Adnexa: normal bimanual exam without masses or fullness        Uterus: normal by palpation        Pap smear: performed Msk:  normal ROM, no joint tenderness, no joint swelling, no joint warmth, no redness over joints, no joint deformities, no joint instability, and no crepitation.   Pulses:  R posterior tibial normal, R dorsalis pedis normal, R carotid normal, L posterior tibial normal, L dorsalis pedis normal, and L carotid normal.   Extremities:  No clubbing, cyanosis, edema, or deformity noted with normal full range of motion of all joints.   Neurologic:  No cranial nerve deficits noted. Station and gait are normal. Plantar reflexes are down-going bilaterally. DTRs are symmetrical throughout. Sensory, motor and coordinative functions appear intact. Skin:  Mult moles --chin,,  r arm large lipoma R side upper back Cervical Nodes:  No lymphadenopathy noted Axillary Nodes:  No palpable lymphadenopathy Psych:  Oriented X3, normally interactive, not anxious appearing, and not depressed appearing.     Impression & Recommendations:  Problem # 1:  PREVENTIVE HEALTH CARE  (ICD-V70.0)  Orders: Venipuncture (66440) TLB-Lipid Panel (80061-LIPID) TLB-BMP (Basic Metabolic Panel-BMET) (80048-METABOL) TLB-CBC Platelet - w/Differential (85025-CBCD) TLB-Hepatic/Liver Function Pnl (80076-HEPATIC) TLB-TSH (Thyroid Stimulating Hormone) (34742-VZD) Radiology Referral (Radiology) EKG w/ Interpretation (93000)  Problem # 2:  DEPRESSION (ICD-311)  Her updated medication list for this problem includes:    Prozac 40 Mg Caps (Fluoxetine hcl) .Marland Kitchen... 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 Take one capsule daily 3)  Abilify 5 Mg Tabs (Aripiprazole) .Marland Kitchen.. 1 by mouth once daily 4)  Phillips Liqui-gels 100 Mg Caps (Docusate sodium) .... As needed 5)  Calcium 500 Mg Tabs (Calcium) .Marland Kitchen.. 1 by mouth three times a day 6)  Vitamin D3 1000 Unit Tabs (Cholecalciferol) .Marland Kitchen.. 1 by mouth once daily Prescriptions: ABILIFY 5 MG TABS (ARIPIPRAZOLE) 1 by mouth once  daily  #30 x 2   Entered and Authorized by:   Loreen Freud DO   Signed by:   Loreen Freud DO on 08/17/2009   Method used:   [facility] to        [id number]       [address]       [place], [place]  [address]       Ph: [phone] or [phone]       Fax: [phone]   RxID:   [id number]    EKG  Procedure date:  08/17/2009  Findings:      Normal sinus rhythm with rate of:  66

## 2010-04-17 NOTE — Progress Notes (Signed)
Summary: Lab Results (lmom 6/8)  Phone Note Outgoing Call   Call placed by: Army Fossa CMA,  August 23, 2009 10:03 AM Reason for Call: Discuss lab or test results Summary of Call: Regarding Pap results, LMTCB:  insufficient cells---  repeat in 3-6 months Signed by Loreen Freud DO on 08/23/2009 at 9:08 AM  Follow-up for Phone Call        spoke w/ patient aware of results.....Christine Schwartz  August 23, 2009 5:02 PM

## 2010-04-17 NOTE — Assessment & Plan Note (Signed)
Summary: rash in between and under breasts, stomach, back//lch   Vital Signs:  Patient profile:   55 year old female Height:      65 inches Weight:      126.25 pounds BMI:     21.09 Pulse rate:   64 / minute BP sitting:   110 / 60  Vitals Entered By: Kandice Hams (July 14, 2009 9:07 AM) CC: C/O RASH UNDER BREAST AND RIBS AND BACK   History of Present Illness: 55 yo woman here today for rash.  sxs started 3-4 weeks ago.  pt reports it's worsening.  initially started as pale pink and now it's dark.  only itches when pt is hot- after showers, hot flashes.  no hx of similar.  no family members w/ similar.  reports Dr Laury Axon gave her a list of psychiatrists to pick from but she misplaced this.  would like another.  also needs refill on Prozac.  Allergies (verified): No Known Drug Allergies  Review of Systems      See HPI  Physical Exam  General:  Well-developed,well-nourished,in no acute distress; alert,appropriate and cooperative throughout examination Skin:  areas on chest and back consistent w/ tinea versicolor.   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  start Ketoconazole tabs.  script for selenium sulfide lotion given for next outbreak- told to use at first sign to avoid becoming so wide spread.  Pt expresses understanding and is in agreement w/ this plan. Her updated medication list for this problem includes:    Ketoconazole 200 Mg Tabs (Ketoconazole) .Marland Kitchen... 1 tab daily x5 days  Problem # 2:  DEPRESSION (ICD-311) Assessment: Unchanged refill provided along w/ list of psychiatrists and therapists.  pt appreciative.  will f/u w/ PCP. Her updated medication list for this problem includes:    Prozac 40 Mg Caps (Fluoxetine hcl) .Marland Kitchen... Take one capsule daily  Complete Medication List: 1)  Glucosamine-chondroitin 500-400 Mg Tabs (Glucosamine-chondroitin) 2)  Prozac 40 Mg Caps  (Fluoxetine hcl) .... Take one capsule daily 3)  Nasacort Aq 55 Mcg/act Aers (Triamcinolone acetonide(nasal)) .... 2 spray each nostril once daily 4)  Astepro 0.15 % Soln (Azelastine hcl) .... 2 sprays each nostril once daily 5)  Abilify 5 Mg Tabs (Aripiprazole) .Marland Kitchen.. 1 by mouth once daily 6)  Ketoconazole 200 Mg Tabs (Ketoconazole) .Marland Kitchen.. 1 tab daily x5 days 7)  Selenium Sulfide 2.5 % Lotn (Selenium sulfide) .... Apply lotion to affected area for 10 minutes x7 days.  disp 1 large bottle  Patient Instructions: 1)  Follow up with Dr Laury Axon in 1-2 weeks to discuss stress/anxiety 2)  Call a psychiatrist to establish 3)  Take the Ketoconazole as directed 4)  If you notice the spots return- use the lotion as directed 5)  This is called Tinea Versicolor 6)  Call with any questions or concerns 7)  Hang in there!! Prescriptions: PROZAC 40 MG CAPS (FLUOXETINE HCL) Take one capsule daily  #30 x 1   Entered and Authorized by:   Neena Rhymes MD   Signed by:   Neena Rhymes MD on 07/14/2009   Method used:   [facility]        [id number]       [address]       [place], [place]  [address]       Ph: [phone] or [phone]  Fax: [phone]   RxID:   [id number] SELENIUM SULFIDE 2.5 % LOTN (SELENIUM SULFIDE) apply lotion to affected area for 10 minutes x7 days.  disp 1 large bottle  #1 x 3   Entered and Authorized by:   Neena Rhymes MD   Signed by:   Neena Rhymes MD on 07/14/2009   Method used:   [facility]        [facility] [id number]       [address]       [place], [place]  [address]       Ph: [phone] or [phone]       Fax: [phone]   RxID:   [id number] KETOCONAZOLE 200 MG TABS (KETOCONAZOLE) 1 tab daily x5 days  #5 x 0   Entered and Authorized by:   Neena Rhymes MD   Signed by:   Neena Rhymes MD on 07/14/2009   Method used:   [facility]         [id number]       [address]       [place], [place]  [address]       Ph: [phone] or [phone]       Fax: [phone]   RxID:   [id number]

## 2010-04-17 NOTE — Assessment & Plan Note (Signed)
Summary: needs to be seen to renew abilify///sph   Vital Signs:  Patient profile:   55 year old female Weight:      134 pounds Pulse rate:   60 / minute Pulse rhythm:   regular BP sitting:   110 / 66  (left arm) Cuff size:   regular  Vitals Entered By: Almeta Monas CMA Duncan Dull) (February 02, 2010 3:48 PM) CC: f/u on Abilify   History of Present Illness: Pt here to refill abilify.  Pt has not seen psych yet.  She feels there has been an improvement but still feels stressed.     Current Medications (verified): 1)  Glucosamine-Chondroitin 500-400 Mg  Tabs (Glucosamine-Chondroitin) 2)  Prozac 40 Mg Caps (Fluoxetine Hcl) .... Take One Capsule Daily 3)  Abilify 5 Mg Tabs (Aripiprazole) .Marland Kitchen.. 1 By Mouth Once Daily 4)  Phillips Liqui-Gels 100 Mg Caps (Docusate Sodium) .... As Needed 5)  Calcium 500 Mg Tabs (Calcium) .Marland Kitchen.. 1 By Mouth Three Times A Day 6)  Vitamin D3 1000 Unit Tabs (Cholecalciferol) .Marland Kitchen.. 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 Family History of  Alcoholism/Addiction Family History of Arthritis Family History Diabetes 1st degree relative Family History Hypertension F-- alz dementia  Social History: Reviewed history from 06/21/2008 and no changes required. 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 Alert and cooperative with normal attention span and concentration. No apparent delusions, illusions, hallucinations   Impression & Recommendations:  Problem # 1:  DEPRESSION (ICD-311) refill abilify and con't prozac pt to make appointment with psych Her updated medication list for this problem includes:    Prozac 40 Mg Caps (Fluoxetine hcl) .Marland Kitchen... Take one capsule daily  Problem # 2:  DYSURIA (ICD-788.1)  check urine culture Discussed medication use and symptom relief.   Encouraged to push clear liquids, get enough rest, and take acetaminophen as needed. To be seen in 10 days if no improvement, sooner if worse.  Complete Medication List: 1)  Glucosamine-chondroitin 500-400 Mg Tabs (Glucosamine-chondroitin) 2)  Prozac 40 Mg Caps (Fluoxetine hcl) .... Take one capsule daily 3)  Abilify 5 Mg Tabs (Aripiprazole) .Marland Kitchen.. 1 by mouth once daily 4)  Phillips Liqui-gels 100 Mg Caps (Docusate sodium) .... As needed 5)  Calcium 500 Mg Tabs (Calcium) .Marland Kitchen.. 1 by mouth three times a day 6)  Vitamin D3 1000 Unit Tabs (Cholecalciferol) .Marland Kitchen.. 1 by mouth once daily  Other Orders: T-Culture, Urine ([id number]) T-Chlamydia  Probe, urine ([id number]) T-GC Probe, urine ([id number]) Prescriptions: ABILIFY 5 MG TABS (ARIPIPRAZOLE) 1 by mouth once daily  #30 Tablet x 5  Entered and Authorized by:   Loreen Freud DO   Signed by:   Loreen Freud DO on 02/02/2010   Method  used:   [facility]        [id number]       [address]       [place], [place]  [address]       Ph: [phone] or [phone]       Fax: [phone]   RxID:   [id number] PROZAC 40 MG CAPS (FLUOXETINE HCL) Take one capsule daily  #30 x 5   Entered and Authorized by:   Loreen Freud DO   Signed by:   Loreen Freud DO on 02/02/2010   Method used:   [facility] to        [id number]       [address]       [place], [place]  [address]       Ph: [phone] or [phone]       Fax: [phone]   RxID:   [id number]    Orders Added: 1)  T-Culture, Urine [[id number]] 2)  T-Chlamydia  Probe, urine [phone] 3)  T-GC Probe, urine [id number] 4)  Est. Patient Level III [[address]]    Laboratory Results   Urine Tests    Routine Urinalysis   Color: yellow Appearance: Cloudy Glucose: negative   (Normal Range: Negative) Bilirubin: negative   (Normal Range: Negative) Ketone: negative   (Normal Range: Negative) Spec. Gravity: 1.020   (Normal Range: 1.003-1.035) Blood: trace-lysed   (Normal Range: Negative) pH: 6.0   (Normal Range: 5.0-8.0) Protein: trace   (Normal Range: Negative) Urobilinogen: 0.2   (Normal Range: 0-1) Nitrite: negative   (Normal Range: Negative) Leukocyte Esterace: trace   (Normal Range: Negative)

## 2010-04-17 NOTE — Consult Note (Signed)
Summary: [facility]   Imported By: Lanelle Bal 10/17/2009 14:12:49  _____________________________________________________________________  External Attachment:    Type:   Image     Comment:   External Document

## 2010-04-17 NOTE — Assessment & Plan Note (Signed)
Summary: 1-2 week roa//lch   Vital Signs:  Patient profile:   55 year old female Weight:      125.38 pounds Pulse rate:   72 / minute Pulse rhythm:   regular BP sitting:   110 / 62  (left arm) Cuff size:   regular  Vitals Entered By: Army Fossa CMA (Jul 25, 2009 10:28 AM) CC: Pt here for follow up from Rash she had she saw Dr.Tabori- doing much better.   History of Present Illness: Pt here f/u rash---it has resolved.   Pt has not made appointmtent with psych yet and is not taking abilify---she forgot about it.  Pt feels stress and depression at work esp has gotten worse.  Pt is not suicidal.    Current Medications (verified): 1)  Glucosamine-Chondroitin 500-400 Mg  Tabs (Glucosamine-Chondroitin) 2)  Prozac 40 Mg Caps (Fluoxetine Hcl) .... Take One Capsule Daily 3)  Abilify 5 Mg Tabs (Aripiprazole) .Marland Kitchen.. 1 By Mouth Once Daily 4)  Phillips Liqui-Gels 100 Mg Caps (Docusate Sodium) .... As Needed  Allergies (verified): No Known Drug Allergies  Past History:  Past medical, surgical, family and social histories (including risk factors) reviewed for relevance to current acute and chronic problems.  Past Medical History: Reviewed history from 07/11/2007 and no changes required. Depression Osteoarthritis L hip---olin TUBULAR ADENOMATOUS COLON POLYPS-10/20/2006  Past Surgical History: Reviewed history from 09/09/2006 and no changes required. Lumpectomy (1985)R side  Family History: Reviewed history from 06/21/2008 and no changes required. Family History of Alcoholism/Addiction Family History of Arthritis Family History Diabetes 1st degree relative Family History Hypertension F-- alz dementia  Social History: Reviewed history from 06/21/2008 and no changes required. 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     Impression & Recommendations:  Problem # 1:  DEPRESSION (ICD-311)  Her updated medication list for this problem includes:    Prozac 40 Mg Caps (Fluoxetine hcl) .Marland Kitchen... Take one capsule daily    Pt was not taking abilify--  2mg  sample given for 1 week then take 5 mg daily f/u psych---rto here if unable to get in within 1 month  Problem # 2:  TINEA VERSICOLOR (ICD-111.0) Assessment: Improved resolved. The following medications were removed from the medication list:    Ketoconazole 200 Mg Tabs (Ketoconazole) .Marland Kitchen... 1 tab daily x5 days  Complete Medication List: 1)  Glucosamine-chondroitin 500-400 Mg Tabs (Glucosamine-chondroitin) 2)  Prozac 40 Mg Caps (Fluoxetine hcl) .... Take one capsule daily 3)  Abilify 5 Mg Tabs (Aripiprazole) .Marland Kitchen.. 1 by mouth once daily 4)  Phillips Liqui-gels 100 Mg Caps (Docusate sodium) .... As needed Prescriptions: ABILIFY 5 MG TABS (ARIPIPRAZOLE) 1 by mouth once daily  #30 x 2   Entered and Authorized by:   Loreen Freud DO   Signed by:   Loreen Freud DO on 07/25/2009   Method used:   Print then Give to Patient   RxID:   [id number] PROZAC 40 MG CAPS (FLUOXETINE HCL) Take one capsule daily  #30 x 5   Entered and Authorized by:   Loreen Freud DO   Signed by:   Loreen Freud DO on 07/25/2009  Method used:   [facility]  [address] [id number]       [address]       [place], [place]  [address]       Ph: [phone] or [phone]       Fax: [phone]   RxID:   [id number]

## 2010-04-17 NOTE — Progress Notes (Signed)
Summary: Results   Phone Note Outgoing Call   Call placed by: Almeta Monas CMA Duncan Dull),  February 05, 2010 1:10 PM Call placed to: Patient Details for Reason: RESULTS Summary of Call:  Urine culture----contaminated---if still symptomatic ---recheck pt would need pelvic exam as well  GC/CT---negative... Almeta Monas CMA Duncan Dull)  February 05, 2010 1:10 PM   Follow-up for Phone Call        Discuss with patient............Marland KitchenFelecia Deloach CMA  February 07, 2010 8:34 AM

## 2010-04-19 NOTE — Letter (Signed)
Summary: Results Follow-up Letter  [place] [facility]  [address] [place], [place] [address]   Phone: [phone]  Fax: [phone]       03/08/2010   Christine Schwartz [address] [place], [place]  [address]    Dear Ms. [facility],     The following are the results of your recent test(s):    Test     Result     Pap Smear    Normal             Sincerely,  Almeta Monas CMA (AAMA) [place] [facility]

## 2010-04-19 NOTE — Assessment & Plan Note (Signed)
Summary: followup/kn   Vital Signs:  Patient profile:   55 year old female Menstrual status:  postmenopausal Weight:      135.4 pounds Pulse rate:   60 / minute Pulse rhythm:   regular BP sitting:   116 / 72  (right arm) Cuff size:   regular  Vitals Entered By: Almeta Monas CMA Duncan Dull) (April 02, 2010 11:07 AM) CC: f/u on meds   History of Present Illness: Pt here to f/u depression-- pt doing well with med but libido is better.but still not good.   Intercourse has also been painful.  Problems Prior to Update: 1)  Libido, Decreased  (ICD-799.81) 2)  Pap Smear, Abnormal  (ICD-795.00) 3)  Dysuria  (ICD-788.1) 4)  Nevi, Multiple  (ICD-216.9) 5)  Tinea Versicolor  (ICD-111.0) 6)  Preventive Health Care  (ICD-V70.0) 7)  Upper Respiratory Infection  (ICD-465.9) 8)  Sinusitis, Acute Nos  (ICD-461.9) 9)  Family History Diabetes 1st Degree Relative  (ICD-V18.0) 10)  Family History of Alcoholism/addiction  (ICD-V61.41) 11)  Tinea Corporis  (ICD-110.5) 12)  Preventive Health Care  (ICD-V70.0) 13)  Lipoma, Back  (ICD-214.9) 14)  Eye Surgery, Hx of  (ICD-V15.2) 15)  Osteoarthritis  (ICD-715.90) 16)  Postprocedural Status Nec  (ICD-V45.89) 17)  Depression  (ICD-311)  Medications Prior to Update: 1)  Glucosamine-Chondroitin 500-400 Mg  Tabs (Glucosamine-Chondroitin) 2)  Prozac 40 Mg Caps (Fluoxetine Hcl) .... Take One Capsule Daily 3)  Abilify 5 Mg Tabs (Aripiprazole) .Marland Kitchen.. 1 By Mouth Once Daily 4)  Phillips Liqui-Gels 100 Mg Caps (Docusate Sodium) .... As Needed 5)  Calcium 500 Mg Tabs (Calcium) .Marland Kitchen.. 1 By Mouth Three Times A Day 6)  Vitamin D3 1000 Unit Tabs (Cholecalciferol) .Marland Kitchen.. 1 By Mouth Once Daily 7)  Wellbutrin Xl 150 Mg Xr24h-Tab (Bupropion Hcl) .Marland Kitchen.. 1 By Mouth Qam For 1 Week Then Increase To 2 A Day.  Current Medications (verified): 1)  Glucosamine-Chondroitin 500-400 Mg  Tabs (Glucosamine-Chondroitin) 2)  Prozac 40 Mg Caps (Fluoxetine Hcl) .... Take One Capsule Daily 3)   Calcium 500 Mg Tabs (Calcium) .Marland Kitchen.. 1 By Mouth Three Times A Day 4)  Vitamin D3 1000 Unit Tabs (Cholecalciferol) .Marland Kitchen.. 1 By Mouth Once Daily 5)  Wellbutrin Xl 150 Mg Xr24h-Tab (Bupropion Hcl) .... 3 By Mouth Once Daily 6)  Metrogel-Vaginal 0.75 % Gel (Metronidazole) .Marland Kitchen.. 1 Applicator At Bedtime For 5 Days 7)  Premarin 0.625 Mg/gm Crea (Estrogens, Conjugated) .... 0.5g Pv 2x/ Week For 3 Weeks ---Off 1 Week  Allergies (verified): No Known Drug Allergies  Past History:  Past Medical History: Last updated: 07/11/2007 Depression Osteoarthritis L hip---olin TUBULAR ADENOMATOUS COLON POLYPS-10/20/2006  Past Surgical History: Last updated: 09/09/2006 Lumpectomy (1985)R side  Family History: Last updated: 06/21/2008 Family History of Alcoholism/Addiction Family History of Arthritis Family History Diabetes 1st degree relative Family History Hypertension F-- alz dementia  Social History: Last updated: 06/21/2008 Married Drug use-no Former Smoker Alcohol use-yes Regular exercise-no 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 Family History of Alcoholism/Addiction Family History of Arthritis Family History Diabetes 1st degree relative Family History Hypertension F-- alz dementia  Social History: Reviewed history from 06/21/2008 and no changes required. 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     Impression & Recommendations:  Problem # 1:  DEPRESSION (ICD-311)  Her updated medication list for this problem includes:    Prozac 40 Mg Caps (Fluoxetine hcl) .Marland Kitchen... Take one capsule daily    Wellbutrin Xl 150 Mg Xr24h-tab (Bupropion hcl) .Marland KitchenMarland KitchenMarland KitchenMarland Kitchen 3 by mouth once daily  Problem # 2:  LIBIDO, DECREASED (ICD-799.81) increase wellbutrin to 450 mg  Problem # 3:  VAGINITIS, ATROPHIC (ICD-627.3)  Her updated medication list for this problem includes:    Premarin 0.625 Mg/gm Crea (Estrogens, conjugated) .Marland Kitchen... 0.5g pv 2x/ week for 3 weeks ---off 1 week  Discussed treatment options.   Complete Medication List: 1)  Glucosamine-chondroitin 500-400 Mg Tabs (Glucosamine-chondroitin) 2)  Prozac 40 Mg Caps (Fluoxetine hcl) .... Take one capsule daily 3)  Calcium 500 Mg Tabs (Calcium) .Marland Kitchen.. 1 by mouth three times a day 4)  Vitamin D3 1000 Unit Tabs (Cholecalciferol) .Marland Kitchen.. 1 by mouth once daily 5)  Wellbutrin Xl 150 Mg Xr24h-tab (Bupropion hcl) .... 3 by mouth once daily 6)  Metrogel-vaginal 0.75 % Gel (Metronidazole) .Marland Kitchen.. 1 applicator at bedtime for 5 days 7)  Premarin 0.625 Mg/gm Crea (Estrogens, conjugated) .... 0.5g pv 2x/ week for 3 weeks ---off 1 week  Patient Instructions: 1)  Please schedule a follow-up appointment in 3 months .  Prescriptions: PREMARIN 0.625 MG/GM CREA (ESTROGENS, CONJUGATED) 0.5g pv 2x/ week for 3 weeks ---off 1 week  #1 month x 5   Entered and Authorized by:   Loreen Freud DO   Signed by:   Loreen Freud DO on 04/02/2010   Method used:   [facility]  [address] [id number]       [address]       [place], [place]  [address]       Ph: [phone] or [phone]       Fax: [phone]   RxID:   [id number] WELLBUTRIN XL 150 MG XR24H-TAB (BUPROPION HCL) 3 by mouth once daily  #90 x 5   Entered and Authorized by:   Loreen Freud DO   Signed by:   Loreen Freud DO on  04/02/2010   Method used:   [facility]  [address] [id number]       [address]       [place], [place]  [address]       Ph: [phone] or [phone]       Fax: [phone]   RxID:   [id number] METROGEL-VAGINAL 0.75 % GEL (METRONIDAZOLE) 1 applicator at bedtime for 5 days  #5 x 0   Entered and Authorized by:   Loreen Freud DO   Signed by:   Loreen Freud DO on 04/02/2010   Method used:   Print then Give to Patient   RxID:   [id number]    Orders Added: 1)  Est. Patient Level III [[address]]

## 2010-04-19 NOTE — Assessment & Plan Note (Signed)
Summary: pap only/cbs   Vital Signs:  Patient profile:   55 year old female Menstrual status:  postmenopausal Weight:      137.0 pounds Pulse rate:   60 / minute Pulse rhythm:   regular BP sitting:   112 / 68  (right arm) Cuff size:   regular  Vitals Entered By: Almeta Monas CMA Duncan Dull) (March 05, 2010 2:21 PM) CC: repeat pap only     Menstrual Status postmenopausal Last PAP Result NEGATIVE FOR INTRAEPITHELIAL LESIONS OR MALIGNANCY.   History of Present Illness: pt here for repeat pap secondary to absent endocervical cells.    Pt also c/o decreased libido.    Problems Prior to Update: 1)  Libido, Decreased  (ICD-799.81) 2)  Pap Smear, Abnormal  (ICD-795.00) 3)  Dysuria  (ICD-788.1) 4)  Nevi, Multiple  (ICD-216.9) 5)  Tinea Versicolor  (ICD-111.0) 6)  Preventive Health Care  (ICD-V70.0) 7)  Upper Respiratory Infection  (ICD-465.9) 8)  Sinusitis, Acute Nos  (ICD-461.9) 9)  Family History Diabetes 1st Degree Relative  (ICD-V18.0) 10)  Family History of Alcoholism/addiction  (ICD-V61.41) 11)  Tinea Corporis  (ICD-110.5) 12)  Preventive Health Care  (ICD-V70.0) 13)  Lipoma, Back  (ICD-214.9) 14)  Eye Surgery, Hx of  (ICD-V15.2) 15)  Osteoarthritis  (ICD-715.90) 16)  Postprocedural Status Nec  (ICD-V45.89) 17)  Depression  (ICD-311)  Medications Prior to Update: 1)  Glucosamine-Chondroitin 500-400 Mg  Tabs (Glucosamine-Chondroitin) 2)  Prozac 40 Mg Caps (Fluoxetine Hcl) .... Take One Capsule Daily 3)  Abilify 5 Mg Tabs (Aripiprazole) .Marland Kitchen.. 1 By Mouth Once Daily 4)  Phillips Liqui-Gels 100 Mg Caps (Docusate Sodium) .... As Needed 5)  Calcium 500 Mg Tabs (Calcium) .Marland Kitchen.. 1 By Mouth Three Times A Day 6)  Vitamin D3 1000 Unit Tabs (Cholecalciferol) .Marland Kitchen.. 1 By Mouth Once Daily  Current Medications (verified): 1)  Glucosamine-Chondroitin 500-400 Mg  Tabs (Glucosamine-Chondroitin) 2)  Prozac 40 Mg Caps (Fluoxetine Hcl) .... Take One Capsule Daily 3)  Abilify 5 Mg Tabs  (Aripiprazole) .Marland Kitchen.. 1 By Mouth Once Daily 4)  Phillips Liqui-Gels 100 Mg Caps (Docusate Sodium) .... As Needed 5)  Calcium 500 Mg Tabs (Calcium) .Marland Kitchen.. 1 By Mouth Three Times A Day 6)  Vitamin D3 1000 Unit Tabs (Cholecalciferol) .Marland Kitchen.. 1 By Mouth Once Daily 7)  Wellbutrin Xl 150 Mg Xr24h-Tab (Bupropion Hcl) .Marland Kitchen.. 1 By Mouth Qam For 1 Week Then Increase To 2 A Day.  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 Family History of Alcoholism/Addiction Family History of Arthritis Family History Diabetes 1st degree relative Family History Hypertension F-- alz dementia  Social History: Reviewed history from 06/21/2008 and no changes required. 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     Impression & Recommendations:  Problem # 1:  PAP SMEAR, ABNORMAL (ICD-795.00) repeat done today  pt will most likely need to go to gyn for further evaluation  Problem # 2:  LIBIDO, DECREASED (ICD-799.81) Pt with some depression secondary to decreased libido affecting relationship with husband wellbutrin xl 150  1 by mouth once daily for 1 week then 2 by mouth once daily  f/u 4-6  weeks  Complete Medication List: 1)  Glucosamine-chondroitin 500-400 Mg Tabs (Glucosamine-chondroitin) 2)  Prozac 40 Mg Caps (Fluoxetine hcl) .... Take one capsule daily 3)  Abilify 5 Mg Tabs (Aripiprazole) .Marland Kitchen.. 1 by mouth once daily 4)  Phillips Liqui-gels 100 Mg Caps (Docusate sodium) .... As needed 5)  Calcium 500 Mg Tabs (Calcium) .Marland Kitchen.. 1 by mouth three times a day 6)  Vitamin D3 1000 Unit Tabs (Cholecalciferol) .Marland Kitchen.. 1 by mouth once daily 7)  Wellbutrin Xl 150 Mg Xr24h-tab (Bupropion hcl) .Marland Kitchen.. 1 by mouth qam for 1 week then increase to 2 a day.  Patient Instructions: 1)  RTO 4-6 weeks  Prescriptions: WELLBUTRIN XL 150 MG XR24H-TAB (BUPROPION HCL) 1 by mouth qam for 1 week then increase to 2 a day.  #60 x 0   Entered and Authorized by:   Loreen Freud DO   Signed by:   Loreen Freud DO on 03/05/2010   Method used:   [facility]        [facility]  [address] [id number]       [address]       [place], [place]  [address]       Ph: [phone] or [phone]       Fax: [phone]   RxID:   [id number]    Orders Added: 1)  Est. Patient Level III [phone]

## 2010-06-02 LAB — POCT HEMOGLOBIN-HEMACUE: Hemoglobin: 13.1 g/dL (ref 12.0–15.0)

## 2010-07-31 NOTE — Assessment & Plan Note (Signed)
[facility]                         GASTROENTEROLOGY OFFICE NOTE   NAME:Christine Schwartz, Christine Schwartz                          MRN:          [id number]  DATE:09/02/2006                            DOB:          Jul 30, 1955    REFERRING PHYSICIAN:  Lelon Perla, DO   CONSULTATION/REFERRAL HISTORY AND PHYSICAL   CHIEF COMPLAINT:  Constipation.   HISTORY:  This is a 55 year old white woman who has had problems with  constipation for a number of years, but says it is getting worse.  She  either had a sigmoidoscopy or colonoscopy several years ago.  It sounds  like probably a sigmoidoscopy based upon the history of no sedation, et  [name].  At this point, she is settled in on a regimen of a senna-based  laxative every several days, have 2 to 3 bowel movements a week.  She  has a lack of urge and some straining, though it is more of a lack of  urge.  She is concerned about the possibility of colon cancer.  At  times, she has bright red blood on the tissue.  A number of years ago,  before her sigmoidoscopy exam, she felt very bloated and had problems.  That is not so much of a problem now.  She has increased fluid intake  and fiber to no avail.  There is no family history of colon cancer.   MEDICATIONS:  1. Bupropion XL 150 mg t.i.d.  2. Multivitamin daily.  3. Glucosamine chondroitin daily.  4. Fish oil daily.  5. Calcium daily.   DRUG ALLERGIES:  None known.   PAST MEDICAL HISTORY:  Depression, osteoarthritis, allergic  rhinosinusitis, benign breast surgery.   FAMILY HISTORY:  Father had diabetes.   SOCIAL HISTORY:  She is married, 1 son, lives with her husband.  Quit  smoking 3-and-a-half months ago.  Occasional alcohol.  She works for  [employer].   REVIEW OF SYSTEMS:  See medical history form.  There is a fair amount of  fatigue and joint pain.   PHYSICAL EXAM:  Height 5 feet 4 inches.  Weight 150 pounds.  Blood  pressure 118/62.  Pulse 72.  EYES:  Anicteric.  ENT:  Normal mouth and posterior pharynx.  NECK:  Supple.  No thyromegaly or mass.  CHEST:  Clear.  HEART:  S1, S2.  No murmurs, rubs, or gallops.  ABDOMEN:  Soft and nontender.  No organomegaly or mass.  RECTAL:  In the presence of female nursing staff.  This shows minimal if  any stool.  Normal sphincter tone.  Good squeeze.  No abnormal descent.  No mass.  LYMPHATICS:  No mass or supraclavicular nodes.  EXTREMITIES:  No edema.  NEURO/PSYCH:  Alert and oriented x3.  Appropriate affect.   ASSESSMENT:  This lady has worsening constipation and rectal bleeding  problems.  It sounds like she had a sigmoidoscopy and not a colonoscopy  a number of years ago.  She cannot remember where that procedure was  done, though apparently, it was not abnormal.   PLAN:  Schedule colonoscopy to investigate the worsening  constipation  and rectal bleeding in a 55 year old woman.  Further plans pending that.  We will start MiraLax daily at this time, as I think that may be a  better regimen for her.  She has never used that.   I appreciate the opportunity to care for this patient.     Iva Boop, MD,FACG  Electronically Signed    CEG/MedQ  DD: 09/03/2006  DT: 09/03/2006  Job #: [id number]   cc:   Lelon Perla, DO

## 2010-09-24 ENCOUNTER — Encounter: Payer: Self-pay | Admitting: Family Medicine

## 2010-11-08 ENCOUNTER — Other Ambulatory Visit: Payer: Self-pay | Admitting: Family Medicine

## 2010-11-08 MED ORDER — FLUOXETINE HCL 40 MG PO CAPS: 40.0000 mg | ORAL_CAPSULE | Freq: Every day | ORAL | Status: DC

## 2010-11-08 NOTE — Telephone Encounter (Signed)
Faxed.   KP 

## 2010-12-11 ENCOUNTER — Encounter: Payer: Self-pay | Admitting: Family Medicine

## 2010-12-11 ENCOUNTER — Ambulatory Visit (INDEPENDENT_AMBULATORY_CARE_PROVIDER_SITE_OTHER): Payer: Managed Care, Other (non HMO) | Admitting: Family Medicine

## 2010-12-11 VITALS — BP 116/68 | HR 76 | Temp 99.0°F | Wt 124.8 lb

## 2010-12-11 DIAGNOSIS — J309 Allergic rhinitis, unspecified: Secondary | ICD-10-CM

## 2010-12-11 DIAGNOSIS — R21 Rash and other nonspecific skin eruption: Secondary | ICD-10-CM

## 2010-12-11 DIAGNOSIS — Z9109 Other allergy status, other than to drugs and biological substances: Secondary | ICD-10-CM

## 2010-12-11 DIAGNOSIS — R1310 Dysphagia, unspecified: Secondary | ICD-10-CM

## 2010-12-11 MED ORDER — OMEPRAZOLE 20 MG PO CPDR: DELAYED_RELEASE_CAPSULE | ORAL | Status: DC

## 2010-12-11 NOTE — Patient Instructions (Signed)
Rash-Generic     Many things can cause a rash. We are not certain what is causing the rash that you have. Some causes include infection, allergic reactions, medications, and chemicals. Sometimes something in your home that comes in contact with your skin may cause the rash. These include pets, new soaps, cosmetics, and foods.     HOME CARE INSTRUCTIONS   Avoid extreme heat or cold, unless otherwise instructed. This can make the itching worse.   A cool bath or shower or a cool washcloth can sometimes ease the itching.   Avoid scratching. This can cause infection.   Take those medications prescribed by your caregiver.     SEEK IMMEDIATE MEDICAL ATTENTION IF:   You develop increasing pain, swelling, or redness.   You develop a fever.   You develop new or severe symptoms such as body aches and pains, diarrhea, vomiting.   Your rash is not better in 3 days.     Document Released: 02/22/2002  Document Re-Released: 05/31/2008  Exit[employer] Patient Information 2011 [employer].

## 2010-12-11 NOTE — Progress Notes (Signed)
  Subjective:     Christine Schwartz is a 55 y.o. female who presents for evaluation of a rash involving the chest, foot, forearm and trunk. Rash started 1 week ago. Lesions are pink, and raised in texture. Rash has changed over time. Rash is pruritic. Associated symptoms: none. Patient denies: abdominal pain. Patient has not had contacts with similar rash. Patient has not had new exposures (soaps, lotions, laundry detergents, foods, medications, plants, insects or animals).  Pt feels like food is getting stuck.    The following portions of the patient's history were reviewed and updated as appropriate: allergies, current medications, past family history, past medical history, past social history, past surgical history and problem list.  Review of Systems Pertinent items are noted in HPI.    Objective:    BP 116/68  Pulse 76  Temp(Src) 99 F (37.2 C) (Oral)  Wt 124 lb 12.8 oz (56.609 kg)  SpO2 96% General:  alert, cooperative, appears stated age and no distress  Skin:  papules noted on extremities and trunk     Assessment:    rash ? Allergic reaction  dysphagia Plan:  Rash resolving  antihistamine prilosec rto if symptoms worsen

## 2010-12-12 LAB — CBC WITH DIFFERENTIAL/PLATELET
Basophils Absolute: 0 10*3/uL (ref 0.0–0.1)
HCT: 36.3 % (ref 36.0–46.0)
Lymphs Abs: 1.4 10*3/uL (ref 0.7–4.0)
MCV: 98.5 fl (ref 78.0–100.0)
Monocytes Absolute: 0.6 10*3/uL (ref 0.1–1.0)
Monocytes Relative: 13.7 % — ABNORMAL HIGH (ref 3.0–12.0)
Platelets: 305 10*3/uL (ref 150.0–400.0)
RDW: 13.4 % (ref 11.5–14.6)

## 2010-12-12 LAB — SEDIMENTATION RATE: Sed Rate: 18 mm/hr (ref 0–22)

## 2010-12-13 LAB — ALLERGY PROFILE REGION II-DC, DE, MD, ~~LOC~~, VA
Alternaria Alternata: <0.1 kU/L (ref <0.35)
Bermuda Grass: 0.12 kU/L (ref <0.35)
Cat Dander: <0.1 kU/L (ref <0.35)
Dog Dander: <0.1 kU/L (ref <0.35)
Elm IgE: <0.1 kU/L (ref <0.35)
IgE (Immunoglobulin E), Serum: 45 IU/mL (ref 0.0–180.0)
Johnson Grass: <0.1 kU/L (ref <0.35)
Pecan/Hickory Tree IgE: <0.1 kU/L (ref <0.35)

## 2010-12-17 ENCOUNTER — Telehealth: Payer: Self-pay

## 2010-12-17 DIAGNOSIS — R21 Rash and other nonspecific skin eruption: Secondary | ICD-10-CM

## 2010-12-17 NOTE — Telephone Encounter (Signed)
Message copied by Arnette Norris on Mon Dec 17, 2010  6:04 PM ------      Message from: Lelon Perla      Created: Wed Dec 12, 2010  5:15 PM       + anemia------take otc iron daily and recheck 1 month  285.9  Cbcd, ibc, ferritin

## 2010-12-17 NOTE — Telephone Encounter (Signed)
Discussed with patient and referral has been put in     KP 

## 2011-03-11 ENCOUNTER — Other Ambulatory Visit: Payer: Self-pay | Admitting: Family Medicine

## 2011-06-10 ENCOUNTER — Other Ambulatory Visit: Payer: Self-pay | Admitting: Family Medicine

## 2011-06-10 MED ORDER — FLUOXETINE HCL 40 MG PO CAPS: 40.0000 mg | ORAL_CAPSULE | Freq: Every day | ORAL | Status: DC

## 2011-06-10 NOTE — Telephone Encounter (Signed)
Refill for  Fluoxetine HCL 40MG  Capsule 40MG  Qty 90days Last written 12.26.13 Last directions Take 1-capsule daily

## 2011-08-03 ENCOUNTER — Other Ambulatory Visit: Payer: Self-pay | Admitting: Family Medicine

## 2011-08-05 ENCOUNTER — Other Ambulatory Visit: Payer: Self-pay | Admitting: Family Medicine

## 2011-09-11 ENCOUNTER — Ambulatory Visit: Payer: Managed Care, Other (non HMO) | Admitting: Family Medicine

## 2011-09-12 ENCOUNTER — Ambulatory Visit (INDEPENDENT_AMBULATORY_CARE_PROVIDER_SITE_OTHER): Payer: Managed Care, Other (non HMO) | Admitting: Family Medicine

## 2011-09-12 ENCOUNTER — Encounter: Payer: Self-pay | Admitting: Family Medicine

## 2011-09-12 VITALS — BP 102/64 | HR 74 | Temp 98.3°F | Wt 127.6 lb

## 2011-09-12 DIAGNOSIS — F329 Major depressive disorder, single episode, unspecified: Secondary | ICD-10-CM

## 2011-09-12 DIAGNOSIS — J329 Chronic sinusitis, unspecified: Secondary | ICD-10-CM

## 2011-09-12 DIAGNOSIS — F411 Generalized anxiety disorder: Secondary | ICD-10-CM

## 2011-09-12 DIAGNOSIS — F419 Anxiety disorder, unspecified: Secondary | ICD-10-CM

## 2011-09-12 MED ORDER — FLUOXETINE HCL 40 MG PO CAPS: 40.0000 mg | ORAL_CAPSULE | Freq: Every day | ORAL | Status: DC

## 2011-09-12 MED ORDER — AMOXICILLIN-POT CLAVULANATE 875-125 MG PO TABS: 1.0000 | ORAL_TABLET | Freq: Two times a day (BID) | ORAL | Status: AC

## 2011-09-12 MED ORDER — BUPROPION HCL ER (XL) 150 MG PO TB24: 450.0000 mg | ORAL_TABLET | Freq: Every day | ORAL | Status: DC

## 2011-09-12 NOTE — Progress Notes (Signed)
  Subjective:     Christine Schwartz is a 56 y.o. female who presents for evaluation of sinus pain. Symptoms include: congestion, facial pain, headaches, nasal congestion, sinus pressure and R ear popping. Onset of symptoms was 7 days ago. Symptoms have been gradually worsening since that time. Past history is significant for allergies. Patient is a smoker  (1/2 ppd x 20 yrs).  The following portions of the patient's history were reviewed and updated as appropriate: allergies, current medications, past family history, past medical history, past social history, past surgical history and problem list.  Review of Systems Pertinent items are noted in HPI.   Objective:    BP 102/64  Pulse 74  Temp 98.3 F (36.8 C) (Oral)  Wt 127 lb 9.6 oz (57.879 kg)  SpO2 98% General appearance: alert, cooperative, appears stated age and no distress Ears: abnormal TM right ear - erythematous Nose: yellow discharge, moderate congestion, turbinates red, swollen, sinus tenderness bilateral Throat: abnormal findings: mild oropharyngeal erythema and pnd Neck: mild anterior cervical adenopathy, supple, symmetrical, trachea midline and thyroid not enlarged, symmetric, no tenderness/mass/nodules Lungs: clear to auscultation bilaterally Heart: S1, S2 normal Extremities: extremities normal, atraumatic, no cyanosis or edema    Assessment:    Acute bacterial sinusitis.    Plan:    Nasal steroids per medication orders. Antihistamines per medication orders. Augmentin per medication orders.

## 2011-09-12 NOTE — Patient Instructions (Signed)

## 2011-09-16 ENCOUNTER — Encounter: Payer: Self-pay | Admitting: Internal Medicine

## 2011-10-17 ENCOUNTER — Encounter: Payer: Self-pay | Admitting: Family Medicine

## 2012-06-24 ENCOUNTER — Encounter: Payer: Self-pay | Admitting: Internal Medicine

## 2012-09-21 ENCOUNTER — Other Ambulatory Visit: Payer: Self-pay | Admitting: Family Medicine

## 2013-03-25 ENCOUNTER — Ambulatory Visit: Payer: Self-pay | Admitting: Physician Assistant

## 2013-03-25 VITALS — BP 102/70 | HR 66 | Temp 99.1°F | Resp 18 | Wt 112.0 lb

## 2013-03-25 DIAGNOSIS — R05 Cough: Secondary | ICD-10-CM

## 2013-03-25 DIAGNOSIS — R059 Cough, unspecified: Secondary | ICD-10-CM

## 2013-03-25 DIAGNOSIS — J329 Chronic sinusitis, unspecified: Secondary | ICD-10-CM

## 2013-03-25 LAB — POCT CBC
GRANULOCYTE PERCENT: 74 % (ref 37–80)
HEMATOCRIT: 38 % (ref 37.7–47.9)
Hemoglobin: 11.5 g/dL — AB (ref 12.2–16.2)
Lymph, poc: 1.8 (ref 0.6–3.4)
MCH, POC: 31.1 pg (ref 27–31.2)
MCHC: 30.3 g/dL — AB (ref 31.8–35.4)
MCV: 102.6 fL — AB (ref 80–97)
MID (CBC): 0.6 (ref 0–0.9)
MPV: 8.7 fL (ref 0–99.8)
PLATELET COUNT, POC: 272 10*3/uL (ref 142–424)
POC Granulocyte: 6.8 (ref 2–6.9)
POC LYMPH PERCENT: 19.3 %L (ref 10–50)
POC MID %: 6.7 %M (ref 0–12)
RBC: 3.7 M/uL — AB (ref 4.04–5.48)
RDW, POC: 13.2 %
WBC: 9.2 10*3/uL (ref 4.6–10.2)

## 2013-03-25 MED ORDER — AZITHROMYCIN 500 MG PO TABS: 500.0000 mg | ORAL_TABLET | Freq: Every day | ORAL | Status: DC

## 2013-03-25 MED ORDER — BENZONATATE 100 MG PO CAPS: 100.0000 mg | ORAL_CAPSULE | Freq: Three times a day (TID) | ORAL | Status: DC | PRN

## 2013-03-25 MED ORDER — PROMETHAZINE-DM 6.25-15 MG/5ML PO SYRP: 5.0000 mL | ORAL_SOLUTION | Freq: Four times a day (QID) | ORAL | Status: DC | PRN

## 2013-03-25 NOTE — Patient Instructions (Signed)
Begin taking the azithromycin (antibiotic) as directed.  Be sure to take the full course.  Use the benzonatate (Tessalon Perles) every 8 hours as needed for cough  Use the cough syrup at bedtime, this will probably make you sleepy  Continue using the fluticasone nasal spray  Restart the Symbicort inhaler  Drink plenty of fluids (water is best!)  Please let us know if any symptoms are worsening or not improving  Stop smoking :)

## 2013-03-25 NOTE — Progress Notes (Signed)
Subjective:    Patient ID: Christine Schwartz, female    DOB: Dec 09, 1955, 59 y.o.   MRN: [id number]  HPI   Christine Schwartz is a very pleasant 58 yr old female here with concern for illness. Reports that she first became sick two days after Christmas (about 10 days ago.)  Husband had been sick with a cold.  She then developed similar symptoms.  Symptoms have persisted and in fact seem to be worsening.  She complains of chest congestion and cough.  Cough is productive occasionally - sometimes a brownish color.  She also has sinus congestion and nasal drainage.  She does note some wheezing and shortness of breath.  She is constantly coughing the last few days.  No fever.  She is a current smoker, knows this is not helping symptoms.  She has used dayquil, goody powder, and claritin d for symptoms.  Also uses Flonase.  Symbicort is on med list but pt is not using and is unsure what this is for - thinks may have been prescribed for acute illness.  Denies chronic lung disease.    Review of Systems  Constitutional: Negative for fever and chills.  HENT: Positive for congestion and rhinorrhea.   Respiratory: Positive for cough, shortness of breath and wheezing.   Cardiovascular: Negative.   Gastrointestinal: Negative.   Musculoskeletal: Negative.   Skin: Negative.        Objective:   Physical Exam  Vitals reviewed. Constitutional: She is oriented to person, place, and time. She appears well-developed and well-nourished. No distress.  HENT:  Head: Normocephalic and atraumatic.  Right Ear: Tympanic membrane and ear canal normal.  Left Ear: Tympanic membrane and ear canal normal.  Nose: Mucosal edema and rhinorrhea present. Right sinus exhibits maxillary sinus tenderness. Right sinus exhibits no frontal sinus tenderness. Left sinus exhibits maxillary sinus tenderness. Left sinus exhibits no frontal sinus tenderness.  Mouth/Throat: Uvula is midline, oropharynx is clear and moist and mucous membranes are  normal.  Eyes: Conjunctivae are normal. No scleral icterus.  Neck: Neck supple.  Cardiovascular: Normal rate, regular rhythm and normal heart sounds.   Pulmonary/Chest: Effort normal. She has wheezes (fine, intermittent, expiratory in RUL). She has no rales.  Lymphadenopathy:    She has no cervical adenopathy.  Neurological: She is alert and oriented to person, place, and time.  Skin: Skin is warm and dry.  Psychiatric: She has a normal mood and affect. Her behavior is normal.   Results for orders placed in visit on 03/25/13  POCT CBC      Result Value Range   WBC 9.2  4.6 - 10.2 K/uL   Lymph, poc 1.8  0.6 - 3.4   POC LYMPH PERCENT 19.3  10 - 50 %L   MID (cbc) 0.6  0 - 0.9   POC MID % 6.7  0 - 12 %M   POC Granulocyte 6.8  2 - 6.9   Granulocyte percent 74.0  37 - 80 %G   RBC 3.70 (*) 4.04 - 5.48 M/uL   Hemoglobin 11.5 (*) 12.2 - 16.2 g/dL   HCT, POC 38.0  37.7 - 47.9 %   MCV 102.6 (*) 80 - 97 fL   MCH, POC 31.1  27 - 31.2 pg   MCHC 30.3 (*) 31.8 - 35.4 g/dL   RDW, POC 13.2     Platelet Count, POC 272  142 - 424 K/uL   MPV 8.7  0 - 99.8 fL  Assessment & Plan:  Cough - Plan: POCT CBC, benzonatate (TESSALON) 100 MG capsule, azithromycin (ZITHROMAX) 500 MG tablet, promethazine-dextromethorphan (PROMETHAZINE-DM) 6.25-15 MG/5ML syrup  Sinusitis - Plan: azithromycin (ZITHROMAX) 500 MG tablet   Christine Schwartz is a very pleasant 58 yr old female here with 10 days of worsening cough, sinusitis.  On exam, maxillary sinuses are tender bilaterally; lungs with a few intermittent fine wheezes but otherwise clear.  Pt is recently unemployed and without health insurance and concerned for cost today, so I have not done a CXR.   There is no leukocytosis on CBC, which combined with her lung exam is reassuring.  Will still cover her with abx for sinusitis/bronchitis - azithro 500mg  x 5 days.  Tessalon and Phenergan DM for cough.  Push fluids, rest.  Continue Flonase.  Can restart Symbicort as  well if she still has this at home.  Encouraged smoking cessation.  Discussed RTC precautions including fever, worsening cough, shortness of breath, etc.  Pt understands and agrees with this plan.  Alonza Smoker MHS, PA-C [facility] 1/8/20154:43 PM

## 2014-01-19 ENCOUNTER — Ambulatory Visit: Payer: Managed Care, Other (non HMO) | Admitting: Physician Assistant

## 2014-01-19 ENCOUNTER — Encounter: Payer: Self-pay | Admitting: Physician Assistant

## 2014-01-19 ENCOUNTER — Ambulatory Visit (INDEPENDENT_AMBULATORY_CARE_PROVIDER_SITE_OTHER): Payer: No Typology Code available for payment source | Admitting: Physician Assistant

## 2014-01-19 VITALS — BP 135/79 | HR 82 | Temp 97.9°F | Wt 119.0 lb

## 2014-01-19 DIAGNOSIS — F329 Major depressive disorder, single episode, unspecified: Secondary | ICD-10-CM

## 2014-01-19 DIAGNOSIS — F32A Depression, unspecified: Secondary | ICD-10-CM | POA: Insufficient documentation

## 2014-01-19 MED ORDER — BUPROPION HCL ER (SR) 150 MG PO TB12: ORAL_TABLET | ORAL | Status: DC

## 2014-01-19 MED ORDER — DIAZEPAM 2 MG PO TABS
2.0000 mg | ORAL_TABLET | Freq: Two times a day (BID) | ORAL | Status: DC | PRN
Start: 2014-01-19 — End: 2014-02-17

## 2014-01-19 NOTE — Progress Notes (Signed)
  Subjective:   Christine Schwartz is an 58 y.o. female who presents for evaluation and treatment of depressive symptoms.  Onset approximately 6 months ago, gradually worsening since that time.  Current symptoms include depressed mood, anhedonia, fatigue, feelings of worthlessness/guilt, disturbed sleep, decreased appetite,.  Current treatment for depression:None Sleep problems: Mild   Early awakening:Mild   Energy: Poor Motivation: Excellent Concentration: Poor Rumination/worrying: Moderate Memory: Excellent Tearfulness: Moderate  Anxiety: Mild  Panic: Absent  Overall Mood: Moderately worse  Hopelessness: Absent Suicidal ideation: Absent  Other/Psychosocial Stressors: work Family history positive for depression in the patient's father.  Previous treatment modalities employed include Individual therapy and Medication.  Past episodes of depression: 2013/2014 Organic causes of depression present: None.  Review of Systems Pertinent items are noted in HPI.   Objective:   Mental Status Examination: Posture and motor behavior: Appropriate Dress, grooming, personal hygiene: Appropriate Facial expression: Appropriate Speech: Appropriate Mood: Appropriate Coherency and relevance of thought: Appropriate Thought content: Appropriate Perceptions: Appropriate Orientation:Appropriate Attention and concentration: Appropriate Memory: : Appropriate Information: Not examined Vocabulary: Appropriate Abstract reasoning: Not examined Judgment: Appropriate    Assessment:   Experiencing the following symptoms of depression most of the day nearly every day for more than two consecutive weeks: depressed mood, loss of interests/pleasure, change in sleep, change in appetite or weight, loss of energy, trouble concentrating  Suicide Risk Assessment: Suicidal intent: none Suicidal plan: none Prior suicide attempts: none Recent exposure to suicide:none     Plan:   Depression - Plan: TSH, T4,  free, buPROPion (WELLBUTRIN SR) 150 MG 12 hr tablet, diazepam (VALIUM) 2 MG tablet  Follow-up in 1 month.  Return sooner if needed.  Reviewed concept of depression as biochemical imbalance of neurotransmitters and rationale for treatment. Instructed patient to contact office or on-call physician promptly should condition worsen or any new symptoms appear and provided on-call telephone numbers.

## 2014-01-19 NOTE — Progress Notes (Signed)
Pre visit review using our clinic review tool, if applicable. No additional management support is needed unless otherwise documented below in the visit note. 

## 2014-01-19 NOTE — Patient Instructions (Addendum)
Please begin taking medications as directed.  Reserve the Valium only for acute anxiety.  Follow-up in 1 month with myslef or Dr. Etter Sjogren.  Return sooner if needed.  Depression Depression refers to feeling sad, low, down in the dumps, blue, gloomy, or empty. In general, there are two kinds of depression: 1. Normal sadness or normal grief. This kind of depression is one that we all feel from time to time after upsetting life experiences, such as the loss of a job or the ending of a relationship. This kind of depression is considered normal, is short lived, and resolves within a few days to 2 weeks. Depression experienced after the loss of a loved one (bereavement) often lasts longer than 2 weeks but normally gets better with time. 2. Clinical depression. This kind of depression lasts longer than normal sadness or normal grief or interferes with your ability to function at home, at work, and in school. It also interferes with your personal relationships. It affects almost every aspect of your life. Clinical depression is an illness. Symptoms of depression can also be caused by conditions other than those mentioned above, such as:  Physical illness. Some physical illnesses, including underactive thyroid gland (hypothyroidism), severe anemia, specific types of cancer, diabetes, uncontrolled seizures, heart and lung problems, strokes, and chronic pain are commonly associated with symptoms of depression.  Side effects of some prescription medicine. In some people, certain types of medicine can cause symptoms of depression.  Substance abuse. Abuse of alcohol and illicit drugs can cause symptoms of depression. SYMPTOMS Symptoms of normal sadness and normal grief include the following:  Feeling sad or crying for short periods of time.  Not caring about anything (apathy).  Difficulty sleeping or sleeping too much.  No longer able to enjoy the things you used to enjoy.  Desire to be by oneself all the  time (social isolation).  Lack of energy or motivation.  Difficulty concentrating or remembering.  Change in appetite or weight.  Restlessness or agitation. Symptoms of clinical depression include the same symptoms of normal sadness or normal grief and also the following symptoms:  Feeling sad or crying all the time.  Feelings of guilt or worthlessness.  Feelings of hopelessness or helplessness.  Thoughts of suicide or the desire to harm yourself (suicidal ideation).  Loss of touch with reality (psychotic symptoms). Seeing or hearing things that are not real (hallucinations) or having false beliefs about your life or the people around you (delusions and paranoia). DIAGNOSIS  The diagnosis of clinical depression is usually based on how bad the symptoms are and how long they have lasted. Your health care provider will also ask you questions about your medical history and substance use to find out if physical illness, use of prescription medicine, or substance abuse is causing your depression. Your health care provider may also order blood tests. TREATMENT  Often, normal sadness and normal grief do not require treatment. However, sometimes antidepressant medicine is given for bereavement to ease the depressive symptoms until they resolve. The treatment for clinical depression depends on how bad the symptoms are but often includes antidepressant medicine, counseling with a mental health professional, or both. Your health care provider will help to determine what treatment is best for you. Depression caused by physical illness usually goes away with appropriate medical treatment of the illness. If prescription medicine is causing depression, talk with your health care provider about stopping the medicine, decreasing the dose, or changing to another medicine. Depression caused by the  abuse of alcohol or illicit drugs goes away when you stop using these substances. Some adults need professional  help in order to stop drinking or using drugs. SEEK IMMEDIATE MEDICAL CARE IF:  You have thoughts about hurting yourself or others.  You lose touch with reality (have psychotic symptoms).  You are taking medicine for depression and have a serious side effect. FOR MORE INFORMATION  National Alliance on Mental Illness: www.nami.[employer] of [employer]: https://carter.com/ Document Released: 03/01/2000 Document Revised: 07/19/2013 Document Reviewed: 06/03/2011 [facility] Patient Information 2015 [place], [place]. This information is not intended to replace advice given to you by your health care provider. Make sure you discuss any questions you have with your health care provider.    Thank you for enrolling in [facility]. Please follow the instructions below to securely access your online medical record. MyChart allows you to send messages to your doctor, view your test results, manage appointments, and more.   How Do I Sign Up? 1. In your Internet browser, go to [employer] and enter [email or web address]. 2. Click on the Sign Up Now link in the Sign In box. You will see the New Member Sign Up page. 3. Enter your MyChart Access Code exactly as it appears below. You will not need to use this code after you've completed the sign-up process. If you do not sign up before the expiration date, you must request a new code.  MyChart Access Code: [email or web address] Expires: 03/20/2014  4:05 PM  4. Enter your Social Security Number ([email or web address]) and Date of Birth (mm/dd/yyyy) as indicated and click Submit. You will be taken to the next sign-up page. 5. Create a MyChart ID. This will be your MyChart login ID and cannot be changed, so think of one that is secure and easy to remember. 6. Create a MyChart password. You can change your password at any time. 7. Enter your Password Reset Question and Answer. This can be used at a later time if you forget your password.  8. Enter your  e-mail address. You will receive e-mail notification when new information is available in [facility]. 9. Click Sign Up. You can now view your medical record.   Additional Information Remember, MyChart is NOT to be used for urgent needs. For medical emergencies, dial 911.

## 2014-01-20 LAB — TSH: TSH: 1.39 u[IU]/mL (ref 0.35–4.50)

## 2014-01-20 LAB — T4, FREE: Free T4: 0.84 ng/dL (ref 0.60–1.60)

## 2014-01-27 ENCOUNTER — Telehealth: Payer: Self-pay | Admitting: Family Medicine

## 2014-01-27 NOTE — Telephone Encounter (Signed)
Caller name: Amil, Moseman Relation to pt: self  Call back number: [phone]   Reason for call:  Pt wanted to inform you does not know what medication is causing pressure in head buPROPion (WELLBUTRIN SR) 150 MG 12 hr tablet  or diazepam (VALIUM) 2 MG tablet  In need of clinical advice.

## 2014-01-28 NOTE — Telephone Encounter (Signed)
Pt advised understanding and will call back next week to follow up

## 2014-01-28 NOTE — Telephone Encounter (Signed)
Recommend patient stop both medications today.  Resume Wellbutrin only tomorrow.  If symptoms recur, Wellbutrin is the cause.  If no problem, we can assume it is the Valium.  Follow-up next week.

## 2014-02-17 ENCOUNTER — Ambulatory Visit (INDEPENDENT_AMBULATORY_CARE_PROVIDER_SITE_OTHER): Payer: No Typology Code available for payment source | Admitting: Family Medicine

## 2014-02-17 ENCOUNTER — Encounter: Payer: Self-pay | Admitting: Family Medicine

## 2014-02-17 VITALS — BP 122/64 | HR 76 | Temp 98.6°F | Wt 118.8 lb

## 2014-02-17 DIAGNOSIS — F329 Major depressive disorder, single episode, unspecified: Secondary | ICD-10-CM

## 2014-02-17 DIAGNOSIS — F32A Depression, unspecified: Secondary | ICD-10-CM

## 2014-02-17 MED ORDER — BUPROPION HCL ER (SR) 200 MG PO TB12: 200.0000 mg | ORAL_TABLET | Freq: Two times a day (BID) | ORAL | Status: DC

## 2014-02-17 MED ORDER — BUPROPION HCL ER (XL) 300 MG PO TB24: 300.0000 mg | ORAL_TABLET | Freq: Every day | ORAL | Status: DC

## 2014-02-17 NOTE — Progress Notes (Signed)
   Subjective:    Patient ID: Christine Schwartz, female    DOB: 02-03-1956, 58 y.o.   MRN: [id number]  HPI Pt here to f/u on depression, stress.  She had to stop the valium because of side effects.   She does cry a lot but feels like the wellbutrin is helping some.  No other complaints.    Review of Systems As above    Objective:   Physical Exam BP 122/64 mmHg  Pulse 76  Temp(Src) 98.6 F (37 C) (Oral)  Wt 118 lb 12.8 oz (53.887 kg)  SpO2 95% General appearance: alert, cooperative, appears stated age and no distress Neck: no adenopathy, no carotid bruit, no JVD, supple, symmetrical, trachea midline and thyroid not enlarged, symmetric, no tenderness/mass/nodules Heart: S1, S2 normal Extremities: extremities normal, atraumatic, no cyanosis or edema Psych-- pt is not suicidal or homicidal       Assessment & Plan:  1. [employer] wellbutrin to 200 mg bid  rto 4-6 weeks - buPROPion (WELLBUTRIN SR) 200 MG 12 hr tablet; Take 1 tablet (200 mg total) by mouth 2 (two) times daily.  Dispense: 60 tablet; Refill: 2

## 2014-02-17 NOTE — Patient Instructions (Addendum)
Depression Depression refers to feeling sad, low, down in the dumps, blue, gloomy, or empty. In general, there are two kinds of depression:  Normal sadness or normal grief. This kind of depression is one that we all feel from time to time after upsetting life experiences, such as the loss of a job or the ending of a relationship. This kind of depression is considered normal, is short lived, and resolves within a few days to 2 weeks. Depression experienced after the loss of a loved one (bereavement) often lasts longer than 2 weeks but normally gets better with time.  Clinical depression. This kind of depression lasts longer than normal sadness or normal grief or interferes with your ability to function at home, at work, and in school. It also interferes with your personal relationships. It affects almost every aspect of your life. Clinical depression is an illness. Symptoms of depression can also be caused by conditions other than those mentioned above, such as:  Physical illness. Some physical illnesses, including underactive thyroid gland (hypothyroidism), severe anemia, specific types of cancer, diabetes, uncontrolled seizures, heart and lung problems, strokes, and chronic pain are commonly associated with symptoms of depression.  Side effects of some prescription medicine. In some people, certain types of medicine can cause symptoms of depression.  Substance abuse. Abuse of alcohol and illicit drugs can cause symptoms of depression. SYMPTOMS Symptoms of normal sadness and normal grief include the following:  Feeling sad or crying for short periods of time.  Not caring about anything (apathy).  Difficulty sleeping or sleeping too much.  No longer able to enjoy the things you used to enjoy.  Desire to be by oneself all the time (social isolation).  Lack of energy or motivation.  Difficulty concentrating or remembering.  Change in appetite or weight.  Restlessness or  agitation. Symptoms of clinical depression include the same symptoms of normal sadness or normal grief and also the following symptoms:  Feeling sad or crying all the time.  Feelings of guilt or worthlessness.  Feelings of hopelessness or helplessness.  Thoughts of suicide or the desire to harm yourself (suicidal ideation).  Loss of touch with reality (psychotic symptoms). Seeing or hearing things that are not real (hallucinations) or having false beliefs about your life or the people around you (delusions and paranoia). DIAGNOSIS  The diagnosis of clinical depression is usually based on how bad the symptoms are and how long they have lasted. Your health care provider will also ask you questions about your medical history and substance use to find out if physical illness, use of prescription medicine, or substance abuse is causing your depression. Your health care provider may also order blood tests. TREATMENT  Often, normal sadness and normal grief do not require treatment. However, sometimes antidepressant medicine is given for bereavement to ease the depressive symptoms until they resolve. The treatment for clinical depression depends on how bad the symptoms are but often includes antidepressant medicine, counseling with a mental health professional, or both. Your health care provider will help to determine what treatment is best for you. Depression caused by physical illness usually goes away with appropriate medical treatment of the illness. If prescription medicine is causing depression, talk with your health care provider about stopping the medicine, decreasing the dose, or changing to another medicine. Depression caused by the abuse of alcohol or illicit drugs goes away when you stop using these substances. Some adults need professional help in order to stop drinking or using drugs. [place]  CARE IF:  You have thoughts about hurting yourself or others.  You lose touch  with reality (have psychotic symptoms).  You are taking medicine for depression and have a serious side effect. FOR MORE INFORMATION  National Alliance on Mental Illness: www.nami.[employer]: https://carter.com/ Document Released: 03/01/2000 Document Revised: 07/19/2013 Document Reviewed: 06/03/2011 [facility] Patient Information 2015 [place], [place]. This information is not intended to replace advice given to you by your health care provider. Make sure you discuss any questions you have with your health care provider.   Generalized Anxiety Disorder Generalized anxiety disorder (GAD) is a mental disorder. It interferes with life functions, including relationships, work, and school. GAD is different from normal anxiety, which everyone experiences at some point in their lives in response to specific life events and activities. Normal anxiety actually helps [place] prepare for and get through these life events and activities. Normal anxiety goes away after the event or activity is over.  GAD causes anxiety that is not necessarily related to specific events or activities. It also causes excess anxiety in proportion to specific events or activities. The anxiety associated with GAD is also difficult to control. GAD can vary from mild to severe. People with severe GAD can have intense waves of anxiety with physical symptoms (panic attacks).  SYMPTOMS The anxiety and worry associated with GAD are difficult to control. This anxiety and worry are related to many life events and activities and also occur more days than not for 6 months or longer. People with GAD also have three or more of the following symptoms (one or more in children):  Restlessness.   Fatigue.  Difficulty concentrating.   Irritability.  Muscle tension.  Difficulty sleeping or unsatisfying sleep. DIAGNOSIS GAD is diagnosed through an assessment by your health care provider. Your health care provider will  ask you questions aboutyour mood,physical symptoms, and events in your life. Your health care provider may ask you about your medical history and use of alcohol or drugs, including prescription medicines. Your health care provider may also do a physical exam and blood tests. Certain medical conditions and the use of certain substances can cause symptoms similar to those associated with GAD. Your health care provider may refer you to a mental health specialist for further evaluation. TREATMENT The following therapies are usually used to treat GAD:   Medication. Antidepressant medication usually is prescribed for long-term daily control. Antianxiety medicines may be added in severe cases, especially when panic attacks occur.   Talk therapy (psychotherapy). Certain types of talk therapy can be helpful in treating GAD by providing support, education, and guidance. A form of talk therapy called cognitive behavioral therapy can teach you healthy ways to think about and react to daily life events and activities.  Stress managementtechniques. These include yoga, meditation, and exercise and can be very helpful when they are practiced regularly. A mental health specialist can help determine which treatment is best for you. Some people see improvement with one therapy. However, other people require a combination of therapies. Document Released: 06/29/2012 Document Revised: 07/19/2013 Document Reviewed: 06/29/2012 [facility] Patient Information 2015 [place], [place]. This information is not intended to replace advice given to you by your health care provider. Make sure you discuss any questions you have with your health care provider.

## 2014-02-17 NOTE — Progress Notes (Signed)
Pre visit review using our clinic review tool, if applicable. No additional management support is needed unless otherwise documented below in the visit note. 

## 2014-02-18 ENCOUNTER — Telehealth: Payer: Self-pay | Admitting: Family Medicine

## 2014-02-18 NOTE — Telephone Encounter (Signed)
emmi emailed °

## 2014-04-05 ENCOUNTER — Telehealth: Payer: Self-pay | Admitting: Family Medicine

## 2014-04-05 NOTE — Telephone Encounter (Signed)
Caller name: Chelcie Relation to pt: self Call back number:  [phone] Pharmacy:  Reason for call:    Patient states that she is now using [facility]

## 2014-04-05 NOTE — Telephone Encounter (Signed)
Updated.      KP

## 2014-04-11 ENCOUNTER — Other Ambulatory Visit: Payer: Self-pay

## 2014-04-11 DIAGNOSIS — F32A Depression, unspecified: Secondary | ICD-10-CM

## 2014-04-11 DIAGNOSIS — F329 Major depressive disorder, single episode, unspecified: Secondary | ICD-10-CM

## 2014-04-11 MED ORDER — BUPROPION HCL ER (XL) 300 MG PO TB24: 300.0000 mg | ORAL_TABLET | Freq: Every day | ORAL | Status: DC

## 2014-08-13 ENCOUNTER — Other Ambulatory Visit: Payer: Self-pay | Admitting: Family Medicine

## 2014-08-16 NOTE — Telephone Encounter (Signed)
Last wellbutrin Rx went to [facility]. Current request from [facility]. Spoke with pt. Currently without insurance and requests to be billed for office visit that is past due. Scheduled f/u for 09/09/14 at 1:15pm with Dr Etter Sjogren. Pt states she no longer uses mail order and would like Rx to local pharmacy. 30 day supply sent to pharmacy.

## 2014-09-09 ENCOUNTER — Encounter: Payer: Self-pay | Admitting: Family Medicine

## 2014-09-09 ENCOUNTER — Ambulatory Visit (INDEPENDENT_AMBULATORY_CARE_PROVIDER_SITE_OTHER): Payer: Self-pay | Admitting: Family Medicine

## 2014-09-09 VITALS — BP 126/84 | HR 82 | Temp 99.1°F | Resp 18 | Ht 65.0 in | Wt 116.0 lb

## 2014-09-09 DIAGNOSIS — F32A Depression, unspecified: Secondary | ICD-10-CM

## 2014-09-09 DIAGNOSIS — F329 Major depressive disorder, single episode, unspecified: Secondary | ICD-10-CM

## 2014-09-09 MED ORDER — CITALOPRAM HYDROBROMIDE 10 MG PO TABS: 10.0000 mg | ORAL_TABLET | Freq: Every day | ORAL | Status: AC

## 2014-09-09 MED ORDER — BUPROPION HCL ER (XL) 300 MG PO TB24: ORAL_TABLET | ORAL | Status: DC

## 2014-09-09 MED ORDER — CITALOPRAM HYDROBROMIDE 10 MG PO TABS: 10.0000 mg | ORAL_TABLET | Freq: Every day | ORAL | Status: DC

## 2014-09-09 NOTE — Progress Notes (Signed)
Pre visit review using our clinic review tool, if applicable. No additional management support is needed unless otherwise documented below in the visit note. 

## 2014-09-09 NOTE — Patient Instructions (Signed)

## 2014-09-09 NOTE — Assessment & Plan Note (Signed)
Add celexa 10 mg qd to wellbutrin Refer to counseling

## 2014-09-09 NOTE — Progress Notes (Signed)
Patient ID: Christine Schwartz, female    DOB: Jun 19, 1955  Age: 59 y.o. MRN: [id number]    Subjective:  Subjective HPI Christine Schwartz presents for f/u depression. Her son's girlfriend is bipolar and stopped her meds.  Pt is concerned for her granddaughters safety.    Review of Systems  Constitutional: Negative for activity change, appetite change, fatigue and unexpected weight change.  Respiratory: Negative for cough and shortness of breath.   Cardiovascular: Negative for chest pain and palpitations.  Psychiatric/Behavioral: Positive for sleep disturbance and dysphoric mood. Negative for suicidal ideas, behavioral problems and self-injury. The patient is not nervous/anxious.     History Past Medical History  Diagnosis Date  . Depression   . Allergy   . Arthritis     She has past surgical history that includes Breast surgery and Eye surgery.   Her family history includes Christine Schwartz in her mother.She reports that she has been smoking.  She has never used smokeless tobacco. She reports that she drinks alcohol. She reports that she does not use illicit drugs.  No current outpatient prescriptions on file prior to visit.   No current facility-administered medications on file prior to visit.     Objective:  Objective Physical Exam  Constitutional: She is oriented to person, place, and time. She appears well-developed and well-nourished.  HENT:  Head: Normocephalic and atraumatic.  Eyes: Conjunctivae and EOM are normal.  Neck: Normal range of motion. Neck supple. No JVD present. Carotid bruit is not present. No thyromegaly present.  Cardiovascular: Normal rate, regular rhythm and normal heart sounds.   No murmur heard. Pulmonary/Chest: Effort normal and breath sounds normal. No respiratory distress. She has no wheezes. She has no rales. She exhibits no tenderness.  Musculoskeletal: She exhibits no edema.  Neurological: She is alert and oriented to person, place, and time.  Psychiatric:  Her behavior is normal. Judgment and thought content normal. Her mood appears anxious. Her affect is not angry. Cognition and memory are normal. She exhibits a depressed mood. She expresses no suicidal plans and no homicidal plans.   BP 126/84 mmHg  Pulse 82  Temp(Src) 99.1 F (37.3 C) (Oral)  Resp 18  Ht 5\' 5"  (1.651 m)  Wt 116 lb (52.617 kg)  BMI 19.30 kg/m2  SpO2 99% Wt Readings from Last 3 Encounters:  09/09/14 116 lb (52.617 kg)  02/17/14 118 lb 12.8 oz (53.887 kg)  01/19/14 119 lb (53.978 kg)     Lab Results  Component Value Date   WBC 9.2 03/25/2013   HGB 11.5* 03/25/2013   HCT 38.0 03/25/2013   PLT 305.0 12/11/2010   GLUCOSE 90 08/17/2009   CHOL 265* 08/17/2009   TRIG 75.0 08/17/2009   HDL 90.10 08/17/2009   LDLDIRECT 158.6 08/17/2009   ALT 24 08/17/2009   AST 21 08/17/2009   NA 144 08/17/2009   K 3.8 08/17/2009   CL 108 08/17/2009   CREATININE 0.4 08/17/2009   BUN 14 08/17/2009   CO2 31 08/17/2009   TSH 1.39 01/19/2014    No results found.   Assessment & Plan:  Plan I am having Christine Schwartz maintain her buPROPion and citalopram.  Meds ordered this encounter  Medications  . DISCONTD: citalopram (CELEXA) 10 MG tablet    Sig: Take 1 tablet (10 mg total) by mouth daily.    Dispense:  30 tablet    Refill:  5  . buPROPion (WELLBUTRIN XL) 300 MG 24 hr tablet    Sig: TAKE 1 TABLET (300 MG  TOTAL) BY MOUTH DAILY.    Dispense:  30 tablet    Refill:  5  . citalopram (CELEXA) 10 MG tablet    Sig: Take 1 tablet (10 mg total) by mouth daily.    Dispense:  30 tablet    Refill:  5    Problem List Items Addressed This Visit    Depression - Primary   Relevant Medications   buPROPion (WELLBUTRIN XL) 300 MG 24 hr tablet   citalopram (CELEXA) 10 MG tablet    pt given number for counseling with Christine Schwartz as well  Follow-up: Return in about 4 weeks (around 10/07/2014), or if symptoms worsen or fail to improve, for depression.  Garnet Koyanagi, DO

## 2014-10-07 ENCOUNTER — Ambulatory Visit: Payer: Self-pay | Admitting: Family Medicine

## 2014-10-08 ENCOUNTER — Other Ambulatory Visit: Payer: Self-pay | Admitting: Family Medicine

## 2014-10-27 ENCOUNTER — Encounter: Payer: Self-pay | Admitting: Family Medicine

## 2014-10-27 ENCOUNTER — Ambulatory Visit (INDEPENDENT_AMBULATORY_CARE_PROVIDER_SITE_OTHER): Payer: Self-pay | Admitting: Family Medicine

## 2014-10-27 VITALS — BP 117/71 | HR 64 | Temp 98.7°F | Ht 65.0 in | Wt 111.2 lb

## 2014-10-27 DIAGNOSIS — F329 Major depressive disorder, single episode, unspecified: Secondary | ICD-10-CM

## 2014-10-27 DIAGNOSIS — F32A Depression, unspecified: Secondary | ICD-10-CM

## 2014-10-27 NOTE — Assessment & Plan Note (Addendum)
con't wellbutrin and celexa Pt will rto for cpe or 1 year -- she still has no insurance needs to wait until she has ins to have cpe [facility]

## 2014-10-27 NOTE — Progress Notes (Signed)
Pre visit review using our clinic review tool, if applicable. No additional management support is needed unless otherwise documented below in the visit note. 

## 2014-10-27 NOTE — Patient Instructions (Signed)

## 2014-10-27 NOTE — Progress Notes (Signed)
Patient ID: Semaya Vida, female    DOB: 08-29-1955  Age: 59 y.o. MRN: [id number]    Subjective:  Subjective HPI Lisbeth Puller presents for f/u depression/ anxiety.  She is doing well.  meds are helping.  No side effects.  Her son has the baby and his girlfriend has left.    Review of Systems  Constitutional: Negative for diaphoresis, appetite change, fatigue and unexpected weight change.  Eyes: Negative for pain, redness and visual disturbance.  Respiratory: Negative for cough, chest tightness, shortness of breath and wheezing.   Cardiovascular: Negative for chest pain, palpitations and leg swelling.  Endocrine: Negative for cold intolerance, heat intolerance, polydipsia, polyphagia and polyuria.  Genitourinary: Negative for dysuria, frequency and difficulty urinating.  Neurological: Negative for dizziness, light-headedness, numbness and headaches.    History Past Medical History  Diagnosis Date  . Depression   . Allergy   . Arthritis     She has past surgical history that includes Breast surgery and Eye surgery.   Her family history includes Diabetes in her mother.She reports that she has been smoking.  She has never used smokeless tobacco. She reports that she drinks alcohol. She reports that she does not use illicit drugs.  Current Outpatient Prescriptions on File Prior to Visit  Medication Sig Dispense Refill  . buPROPion (WELLBUTRIN XL) 300 MG 24 hr tablet TAKE 1 TABLET (300 MG TOTAL) BY MOUTH DAILY. 30 tablet 5  . citalopram (CELEXA) 10 MG tablet Take 1 tablet (10 mg total) by mouth daily. 30 tablet 5   No current facility-administered medications on file prior to visit.     Objective:  Objective Physical Exam  Constitutional: She is oriented to person, place, and time. She appears well-developed and well-nourished.  HENT:  Head: Normocephalic and atraumatic.  Eyes: Conjunctivae and EOM are normal.  Neck: Normal range of motion. Neck supple. No JVD present. Carotid  bruit is not present. No thyromegaly present.  Cardiovascular: Normal rate, regular rhythm and normal heart sounds.   No murmur heard. Pulmonary/Chest: Effort normal and breath sounds normal. No respiratory distress. She has no wheezes. She has no rales. She exhibits no tenderness.  Musculoskeletal: She exhibits no edema.  Neurological: She is alert and oriented to person, place, and time.  Psychiatric: Her behavior is normal. Thought content normal. Her mood appears anxious. Her affect is not angry. She does not exhibit a depressed mood. She expresses no suicidal ideation. She expresses no suicidal plans.   BP 117/71 mmHg  Pulse 64  Temp(Src) 98.7 F (37.1 C) (Oral)  Ht 5\' 5"  (1.651 m)  Wt 111 lb 3.2 oz (50.44 kg)  BMI 18.50 kg/m2  SpO2 99% Wt Readings from Last 3 Encounters:  10/27/14 111 lb 3.2 oz (50.44 kg)  09/09/14 116 lb (52.617 kg)  02/17/14 118 lb 12.8 oz (53.887 kg)     Lab Results  Component Value Date   WBC 9.2 03/25/2013   HGB 11.5* 03/25/2013   HCT 38.0 03/25/2013   PLT 305.0 12/11/2010   GLUCOSE 90 08/17/2009   CHOL 265* 08/17/2009   TRIG 75.0 08/17/2009   HDL 90.10 08/17/2009   LDLDIRECT 158.6 08/17/2009   ALT 24 08/17/2009   AST 21 08/17/2009   NA 144 08/17/2009   K 3.8 08/17/2009   CL 108 08/17/2009   CREATININE 0.4 08/17/2009   BUN 14 08/17/2009   CO2 31 08/17/2009   TSH 1.39 01/19/2014    No results found.   Assessment & Plan:  Plan  I am having Ms. Ambrosius maintain her buPROPion, citalopram, Multiple Vitamins-Minerals (WOMENS 50+ MULTI VITAMIN/MIN PO), Glucosamine-Chondroit-Vit C-Mn (GLUCOSAMINE 1500 COMPLEX PO), cholecalciferol, ferrous sulfate, and naproxen sodium.  Meds ordered this encounter  Medications  . Multiple Vitamins-Minerals (WOMENS 50+ MULTI VITAMIN/MIN PO)    Sig: Take by mouth.  . Glucosamine-Chondroit-Vit C-Mn (GLUCOSAMINE 1500 COMPLEX PO)    Sig: Take by mouth.  . cholecalciferol (VITAMIN D) 1000 UNITS tablet    Sig: Take  1,000 Units by mouth daily.  . ferrous sulfate (GNP IRON) 325 (65 FE) MG tablet    Sig: Take 325 mg by mouth daily with breakfast.  . naproxen sodium (ANAPROX) 220 MG tablet    Sig: Take 220 mg by mouth 2 (two) times daily with a meal.    Problem List Items Addressed This Visit    Depression - Primary      Follow-up: Return in about 1 year (around 10/27/2015), or if symptoms worsen or fail to improve, for annual exam, fasting.  Garnet Koyanagi, DO

## 2015-03-17 ENCOUNTER — Telehealth: Payer: Self-pay | Admitting: *Deleted

## 2015-03-17 NOTE — Telephone Encounter (Signed)
Received fax from [facility] for patient Medical Records; forwarded to Scan/email to Medical Records/SLS

## 2015-03-21 ENCOUNTER — Telehealth: Payer: Self-pay | Admitting: Family Medicine

## 2015-03-21 ENCOUNTER — Other Ambulatory Visit: Payer: Self-pay | Admitting: Family Medicine

## 2015-03-21 MED ORDER — BUPROPION HCL ER (XL) 300 MG PO TB24: 300.0000 mg | ORAL_TABLET | Freq: Every day | ORAL | Status: AC

## 2015-03-21 NOTE — Telephone Encounter (Signed)
Relation to pt: self Call back number:[phone] Pharmacy: [facility] [place], [place] [address] [phone]    Reason for call:  Patient requesting a refill buPROPion (WELLBUTRIN XL) 300 MG 24 hr tablet

## 2015-03-22 ENCOUNTER — Telehealth: Payer: Self-pay | Admitting: Family Medicine

## 2015-03-22 NOTE — Telephone Encounter (Signed)
Caller name:Lynessa Relationship to patient:self Can be reached:[phone] [facility]acy:[facility]   Reason for call:refill bupropion hcl 300 mg tab act qty 30 take 1 by mouth daily

## 2015-03-22 NOTE — Telephone Encounter (Signed)
It was faxed yesterday.     KP

## 2015-05-30 ENCOUNTER — Telehealth: Payer: Self-pay | Admitting: Family Medicine

## 2015-05-30 NOTE — Telephone Encounter (Signed)
Ph# disc - unable to update flu information
